# Patient Record
Sex: Male | Born: 1950 | Race: Black or African American | Hispanic: No | Marital: Married | Smoking: Former smoker
Health system: Southern US, Community
[De-identification: ages and names within clinical notes are randomized; demographics above are authoritative.]

## PROBLEM LIST (undated history)

## (undated) DIAGNOSIS — R7303 Prediabetes: Secondary | ICD-10-CM

## (undated) DIAGNOSIS — E559 Vitamin D deficiency, unspecified: Secondary | ICD-10-CM

## (undated) DIAGNOSIS — J189 Pneumonia, unspecified organism: Secondary | ICD-10-CM

## (undated) DIAGNOSIS — D509 Iron deficiency anemia, unspecified: Secondary | ICD-10-CM

## (undated) DIAGNOSIS — N400 Enlarged prostate without lower urinary tract symptoms: Secondary | ICD-10-CM

## (undated) DIAGNOSIS — E785 Hyperlipidemia, unspecified: Secondary | ICD-10-CM

## (undated) DIAGNOSIS — E669 Obesity, unspecified: Secondary | ICD-10-CM

## (undated) DIAGNOSIS — I10 Essential (primary) hypertension: Secondary | ICD-10-CM

## (undated) DIAGNOSIS — R972 Elevated prostate specific antigen [PSA]: Secondary | ICD-10-CM

## (undated) HISTORY — DX: Iron deficiency anemia, unspecified: D50.9

## (undated) HISTORY — PX: CATARACT EXTRACTION: SUR2

## (undated) HISTORY — DX: Obesity, unspecified: E66.9

## (undated) HISTORY — PX: PROSTATE BIOPSY: SHX241

## (undated) HISTORY — DX: Prediabetes: R73.03

## (undated) HISTORY — PX: HEMORRHOID SURGERY: SHX153

## (undated) HISTORY — DX: Hyperlipidemia, unspecified: E78.5

## (undated) HISTORY — DX: Vitamin D deficiency, unspecified: E55.9

---

## 2002-10-27 ENCOUNTER — Ambulatory Visit (HOSPITAL_COMMUNITY): Admission: RE | Admit: 2002-10-27 | Discharge: 2002-10-27 | Payer: Self-pay | Admitting: Internal Medicine

## 2002-11-02 ENCOUNTER — Ambulatory Visit (HOSPITAL_COMMUNITY): Admission: RE | Admit: 2002-11-02 | Discharge: 2002-11-02 | Payer: Self-pay | Admitting: General Surgery

## 2018-05-10 ENCOUNTER — Other Ambulatory Visit: Payer: Self-pay

## 2018-05-10 ENCOUNTER — Emergency Department (HOSPITAL_COMMUNITY): Payer: Medicare Other

## 2018-05-10 ENCOUNTER — Encounter (HOSPITAL_COMMUNITY): Payer: Self-pay

## 2018-05-10 ENCOUNTER — Inpatient Hospital Stay (HOSPITAL_COMMUNITY)
Admission: EM | Admit: 2018-05-10 | Discharge: 2018-05-13 | DRG: 193 | Disposition: A | Discharge status: Home / Self Care | Payer: Medicare Other | Attending: Internal Medicine | Admitting: Internal Medicine

## 2018-05-10 DIAGNOSIS — N400 Enlarged prostate without lower urinary tract symptoms: Secondary | ICD-10-CM

## 2018-05-10 DIAGNOSIS — Z885 Allergy status to narcotic agent status: Secondary | ICD-10-CM

## 2018-05-10 DIAGNOSIS — R0902 Hypoxemia: Secondary | ICD-10-CM

## 2018-05-10 DIAGNOSIS — Z79899 Other long term (current) drug therapy: Secondary | ICD-10-CM | POA: Diagnosis not present

## 2018-05-10 DIAGNOSIS — N179 Acute kidney failure, unspecified: Secondary | ICD-10-CM | POA: Diagnosis not present

## 2018-05-10 DIAGNOSIS — I10 Essential (primary) hypertension: Secondary | ICD-10-CM

## 2018-05-10 DIAGNOSIS — Z7982 Long term (current) use of aspirin: Secondary | ICD-10-CM | POA: Diagnosis not present

## 2018-05-10 DIAGNOSIS — R3911 Hesitancy of micturition: Secondary | ICD-10-CM

## 2018-05-10 DIAGNOSIS — J9601 Acute respiratory failure with hypoxia: Secondary | ICD-10-CM | POA: Diagnosis present

## 2018-05-10 DIAGNOSIS — E876 Hypokalemia: Secondary | ICD-10-CM | POA: Diagnosis not present

## 2018-05-10 DIAGNOSIS — E860 Dehydration: Secondary | ICD-10-CM | POA: Diagnosis present

## 2018-05-10 DIAGNOSIS — R55 Syncope and collapse: Secondary | ICD-10-CM | POA: Diagnosis present

## 2018-05-10 DIAGNOSIS — J181 Lobar pneumonia, unspecified organism: Secondary | ICD-10-CM | POA: Diagnosis not present

## 2018-05-10 DIAGNOSIS — J189 Pneumonia, unspecified organism: Secondary | ICD-10-CM | POA: Diagnosis present

## 2018-05-10 DIAGNOSIS — S0990XA Unspecified injury of head, initial encounter: Secondary | ICD-10-CM | POA: Diagnosis present

## 2018-05-10 DIAGNOSIS — N401 Enlarged prostate with lower urinary tract symptoms: Secondary | ICD-10-CM | POA: Diagnosis not present

## 2018-05-10 DIAGNOSIS — W1839XA Other fall on same level, initial encounter: Secondary | ICD-10-CM | POA: Diagnosis present

## 2018-05-10 DIAGNOSIS — J9600 Acute respiratory failure, unspecified whether with hypoxia or hypercapnia: Secondary | ICD-10-CM | POA: Diagnosis present

## 2018-05-10 HISTORY — DX: Essential (primary) hypertension: I10

## 2018-05-10 HISTORY — DX: Benign prostatic hyperplasia without lower urinary tract symptoms: N40.0

## 2018-05-10 LAB — BASIC METABOLIC PANEL
Anion gap: 13 (ref 5–15)
BUN: 33 mg/dL — ABNORMAL HIGH (ref 8–23)
CO2: 27 mmol/L (ref 22–32)
Calcium: 8.3 mg/dL — ABNORMAL LOW (ref 8.9–10.3)
Chloride: 92 mmol/L — ABNORMAL LOW (ref 98–111)
Creatinine, Ser: 1.54 mg/dL — ABNORMAL HIGH (ref 0.61–1.24)
GFR calc Af Amer: 53 mL/min — ABNORMAL LOW (ref >60)
GFR calc non Af Amer: 46 mL/min — ABNORMAL LOW (ref >60)
Glucose, Bld: 127 mg/dL — ABNORMAL HIGH (ref 70–99)
Potassium: 3.2 mmol/L — ABNORMAL LOW (ref 3.5–5.1)
Sodium: 132 mmol/L — ABNORMAL LOW (ref 135–145)

## 2018-05-10 LAB — CBC WITH DIFFERENTIAL/PLATELET
Abs Immature Granulocytes: 0.02 10*3/uL (ref 0.00–0.07)
BASOS ABS: 0 10*3/uL (ref 0.0–0.1)
Basophils Relative: 0 %
Eosinophils Absolute: 0.1 10*3/uL (ref 0.0–0.5)
Eosinophils Relative: 2 %
HCT: 50.2 % (ref 39.0–52.0)
HEMOGLOBIN: 15.9 g/dL (ref 13.0–17.0)
Immature Granulocytes: 0 %
LYMPHS ABS: 0.6 10*3/uL — AB (ref 0.7–4.0)
Lymphocytes Relative: 13 %
MCH: 27.4 pg (ref 26.0–34.0)
MCHC: 31.7 g/dL (ref 30.0–36.0)
MCV: 86.6 fL (ref 80.0–100.0)
Monocytes Absolute: 1 10*3/uL (ref 0.1–1.0)
Monocytes Relative: 22 %
Neutro Abs: 2.8 10*3/uL (ref 1.7–7.7)
Neutrophils Relative %: 63 %
Platelets: 167 10*3/uL (ref 150–400)
RBC: 5.8 MIL/uL (ref 4.22–5.81)
RDW: 13.2 % (ref 11.5–15.5)
WBC: 4.5 10*3/uL (ref 4.0–10.5)
nRBC: 0 % (ref 0.0–0.2)

## 2018-05-10 MED ORDER — TAMSULOSIN HCL 0.4 MG PO CAPS
0.4000 mg | ORAL_CAPSULE | Freq: Every day | ORAL | Status: DC
  Administered 2018-05-10 – 2018-05-13 (×4): 0.4 mg via ORAL
  Filled 2018-05-10 (×5): qty 1

## 2018-05-10 MED ORDER — IOPAMIDOL (ISOVUE-370) INJECTION 76%
75.0000 mL | Freq: Once | INTRAVENOUS | Status: AC | PRN
  Administered 2018-05-10: 16:00:00 via INTRAVENOUS

## 2018-05-10 MED ORDER — ONDANSETRON HCL 4 MG/2ML IJ SOLN: 4.0000 mg | Freq: Four times a day (QID) | INTRAMUSCULAR | Status: DC | PRN

## 2018-05-10 MED ORDER — HYDRALAZINE HCL 20 MG/ML IJ SOLN: 5.0000 mg | INTRAMUSCULAR | Status: DC | PRN

## 2018-05-10 MED ORDER — SODIUM CHLORIDE 0.9 % IV BOLUS
1000.0000 mL | Freq: Once | INTRAVENOUS | Status: AC
  Administered 2018-05-10: 1000 mL via INTRAVENOUS

## 2018-05-10 MED ORDER — BENZONATATE 100 MG PO CAPS: 200.0000 mg | ORAL_CAPSULE | Freq: Three times a day (TID) | ORAL | Status: DC | PRN

## 2018-05-10 MED ORDER — ACETAMINOPHEN 325 MG PO TABS: 650.0000 mg | ORAL_TABLET | Freq: Four times a day (QID) | ORAL | Status: DC | PRN

## 2018-05-10 MED ORDER — ACETAMINOPHEN 650 MG RE SUPP: 650.0000 mg | Freq: Four times a day (QID) | RECTAL | Status: DC | PRN

## 2018-05-10 MED ORDER — POTASSIUM CHLORIDE CRYS ER 20 MEQ PO TBCR
10.0000 meq | EXTENDED_RELEASE_TABLET | Freq: Every day | ORAL | Status: DC
  Administered 2018-05-10 – 2018-05-13 (×4): 10 meq via ORAL
  Filled 2018-05-10 (×4): qty 1

## 2018-05-10 MED ORDER — AZITHROMYCIN 250 MG PO TABS
500.0000 mg | ORAL_TABLET | ORAL | Status: DC
  Administered 2018-05-10 – 2018-05-11 (×2): 500 mg via ORAL
  Filled 2018-05-10 (×2): qty 2

## 2018-05-10 MED ORDER — SODIUM CHLORIDE 0.9 % IV SOLN
1.0000 g | INTRAVENOUS | Status: DC
  Administered 2018-05-10 – 2018-05-11 (×2): 1 g via INTRAVENOUS
  Filled 2018-05-10: qty 1
  Filled 2018-05-10: qty 10

## 2018-05-10 MED ORDER — HEPARIN SODIUM (PORCINE) 5000 UNIT/ML IJ SOLN
5000.0000 [IU] | Freq: Three times a day (TID) | INTRAMUSCULAR | Status: DC
  Administered 2018-05-11 – 2018-05-13 (×8): 5000 [IU] via SUBCUTANEOUS
  Filled 2018-05-10 (×7): qty 1

## 2018-05-10 MED ORDER — SODIUM CHLORIDE 0.9 % IV SOLN
INTRAVENOUS | Status: DC
  Administered 2018-05-10 – 2018-05-11 (×2): via INTRAVENOUS

## 2018-05-10 MED ORDER — CHLORTHALIDONE 25 MG PO TABS
25.0000 mg | ORAL_TABLET | Freq: Every day | ORAL | Status: DC
  Administered 2018-05-11 – 2018-05-13 (×3): 25 mg via ORAL
  Filled 2018-05-10 (×4): qty 1

## 2018-05-10 MED ORDER — ONDANSETRON HCL 4 MG PO TABS: 4.0000 mg | ORAL_TABLET | Freq: Four times a day (QID) | ORAL | Status: DC | PRN

## 2018-05-10 MED ORDER — IPRATROPIUM-ALBUTEROL 0.5-2.5 (3) MG/3ML IN SOLN
3.0000 mL | Freq: Once | RESPIRATORY_TRACT | Status: AC
  Administered 2018-05-10: 3 mL via RESPIRATORY_TRACT
  Filled 2018-05-10: qty 3

## 2018-05-10 MED ORDER — SODIUM CHLORIDE 0.9 % IV BOLUS
500.0000 mL | Freq: Once | INTRAVENOUS | Status: AC
  Administered 2018-05-10: 500 mL via INTRAVENOUS

## 2018-05-10 NOTE — ED Notes (Signed)
Pt given Ginger ale at this time.

## 2018-05-10 NOTE — H&P (Addendum)
History and Physical    Jose Scott WUJ:[id number] DOB: January 18, 1951 DOA: 05/10/2018  PCP: Samuel JesterButler, Cynthia, DO Patient coming from: home  Chief Complaint: Syncope  HPI: Jose Scott is a 68 y.o. male with medical history significant of BPH, HTN.   Pt was in his usual state of health until 7 days ago when he developed URI sx. Pt sx included sinus congestion and nasal discharge, PND, cough w/ occasional production. Denies any CP, wheezing. States that he's had very poor oral intake over this time due to simply not feeling well. States he has had fevers and has been in bed a fair amount this week. 2 nights prior to admission pt reports getting up to urinate at 01:00. Felt very weak when initially getting out of bed. Had to hold onto the side of the bed initially. PT reports making it to the bathroom and having a BM. Immediately after standing from having a BM pt felt very lightheaded and had to sit down. Pt then endorses slumping over and waking up to his son standing over him. Denies any head trauma, palpitations, HA, n/v, unilateral weakness, cognitive decline. Pt states that the following night the exact same thing occurred at approximately 02:30. Both times pt was helped back to his bed by his son and felt better once in bed.   ED Course: objective infromation included below.   Review of Systems: As per HPI otherwise all other systems reviewed and are negative  Ambulatory Status:no restrictions  Past Medical History:  Diagnosis Date  . Enlarged prostate   . Hypertension     Past Surgical History:  Procedure Laterality Date  . CATARACT EXTRACTION    . PROSTATE BIOPSY      Social History   Socioeconomic History  . Marital status: Married    Spouse name: Not on file  . Number of children: Not on file  . Years of education: Not on file  . Highest education level: Not on file  Occupational History  . Not on file  Social Needs  . Financial resource strain: Not on file  .  Food insecurity:    Worry: Not on file    Inability: Not on file  . Transportation needs:    Medical: Not on file    Non-medical: Not on file  Tobacco Use  . Smoking status: Never Smoker  . Smokeless tobacco: Never Used  Substance and Sexual Activity  . Alcohol use: Never    Frequency: Never  . Drug use: Never  . Sexual activity: Not on file  Lifestyle  . Physical activity:    Days per week: Not on file    Minutes per session: Not on file  . Stress: Not on file  Relationships  . Social connections:    Talks on phone: Not on file    Gets together: Not on file    Attends religious service: Not on file    Active member of club or organization: Not on file    Attends meetings of clubs or organizations: Not on file    Relationship status: Not on file  . Intimate partner violence:    Fear of current or ex partner: Not on file    Emotionally abused: Not on file    Physically abused: Not on file    Forced sexual activity: Not on file  Other Topics Concern  . Not on file  Social History Narrative  . Not on file    Allergies  Allergen Reactions  .  Codeine    Family History - Hypertension   Prior to Admission medications   Medication Sig Start Date End Date Taking? Authorizing Provider  amLODipine-benazepril (LOTREL) 10-20 MG capsule Take 1 capsule by mouth daily. 03/01/18  Yes [provider]  aspirin 325 MG tablet Take 1 tablet by mouth daily as needed.    Yes [provider]  chlorthalidone (HYGROTON) 25 MG tablet Take 1 tablet by mouth daily. 05/07/18  Yes [provider]  Omega-3 Fatty Acids (FISH OIL) 1000 MG CAPS Take 1 capsule by mouth daily.   Yes [provider]  potassium chloride (KLOR-CON) 8 MEQ tablet Take 1 tablet by mouth daily. 12/10/17  Yes [provider]  tamsulosin (FLOMAX) 0.4 MG CAPS capsule Take 1 capsule by mouth daily. 01/02/18  Yes [provider]  tetrahydrozoline 0.05 % ophthalmic solution Place  2 drops into both eyes 2 (two) times daily as needed.   Yes [provider]    Physical Exam: Vitals:   05/10/18 1530 05/10/18 1600 05/10/18 1630 05/10/18 1700  BP: (!) 151/65 132/87 137/78 135/74  Pulse:  68 67 61  Resp: 17 14 17 13   Temp:      TempSrc:      SpO2:  91% 90% (!) 89%  Weight:      Height:         General:  Appears calm and comfortable Eyes:  PERRL, EOMI, normal lids, iris ENT: Dry MN grossly normal hearing, lips & tongue. Neck:  no LAD, masses or thyromegaly Cardiovascular:  RRR, no m/r/g. No LE edema.  Respiratory: Course breath sounds in the LLL w/ ronchi and wheezing throughout all lung fields L>R Abdomen:  soft, ntnd, NABS Skin:  no rash or induration seen on limited exam Musculoskeletal:  grossly normal tone BUE/BLE, good ROM, no bony abnormality Psychiatric:  grossly normal mood and affect, speech fluent and appropriate, AOx3 Neurologic:  CN 2-12 grossly intact, moves all extremities in coordinated fashion, sensation intact  Labs on Admission: I have personally reviewed following labs and imaging studies  CBC: Recent Labs  Lab 05/10/18 0935  WBC 4.5  NEUTROABS 2.8  HGB 15.9  HCT 50.2  MCV 86.6  PLT 167   Basic Metabolic Panel: Recent Labs  Lab 05/10/18 0935  NA 132*  K 3.2*  CL 92*  CO2 27  GLUCOSE 127*  BUN 33*  CREATININE 1.54*  CALCIUM 8.3*   GFR: Estimated Creatinine Clearance: 57.8 mL/min (A) (by C-G formula based on SCr of 1.54 mg/dL (H)). Liver Function Tests: No results for input(s): AST, ALT, ALKPHOS, BILITOT, PROT, ALBUMIN in the last 168 hours. No results for input(s): LIPASE, AMYLASE in the last 168 hours. No results for input(s): AMMONIA in the last 168 hours. Coagulation Profile: No results for input(s): INR, PROTIME in the last 168 hours. Cardiac Enzymes: No results for input(s): CKTOTAL, CKMB, CKMBINDEX, TROPONINI in the last 168 hours. BNP (last 3 results) No results for input(s): PROBNP in the last 8760  hours. HbA1C: No results for input(s): HGBA1C in the last 72 hours. CBG: No results for input(s): GLUCAP in the last 168 hours. Lipid Profile: No results for input(s): CHOL, HDL, LDLCALC, TRIG, CHOLHDL, LDLDIRECT in the last 72 hours. Thyroid Function Tests: No results for input(s): TSH, T4TOTAL, FREET4, T3FREE, THYROIDAB in the last 72 hours. Anemia Panel: No results for input(s): VITAMINB12, FOLATE, FERRITIN, TIBC, IRON, RETICCTPCT in the last 72 hours. Urine analysis: No results found for: COLORURINE, APPEARANCEUR, LABSPEC, PHURINE, GLUCOSEU,  HGBUR, BILIRUBINUR, KETONESUR, PROTEINUR, UROBILINOGEN, NITRITE, LEUKOCYTESUR  Creatinine Clearance: Estimated Creatinine Clearance: 57.8 mL/min (A) (by C-G formula based on SCr of 1.54 mg/dL (H)).  Sepsis Labs: @LABRCNTIP (procalcitonin:4,lacticidven:4) )No results found for this or any previous visit (from the past 240 hour(s)).   Radiological Exams on Admission: Dg Chest 2 View  Result Date: 05/10/2018 CLINICAL DATA:  Shortness of breath with cough and fever EXAM: CHEST - 2 VIEW COMPARISON:  None. FINDINGS: There is slight bibasilar atelectasis. The lungs elsewhere are clear. Heart size and pulmonary vascularity are normal. No adenopathy. No bone lesions. IMPRESSION: Mild bibasilar atelectasis. No edema or consolidation. Heart size within normal limits. Electronically Signed   By: Bretta Bang III M.D.   On: 05/10/2018 10:09   Ct Angio Chest Pe W/cm &/or Wo Cm  Result Date: 05/10/2018 CLINICAL DATA:  Cough and fever.  Body aches. EXAM: CT ANGIOGRAPHY CHEST WITH CONTRAST TECHNIQUE: Multidetector CT imaging of the chest was performed using the standard protocol during bolus administration of intravenous contrast. Multiplanar CT image reconstructions and MIPs were obtained to evaluate the vascular anatomy. CONTRAST:  <See Chart> ISOVUE-370 IOPAMIDOL (ISOVUE-370) INJECTION 76% COMPARISON:  Radiograph 05/10/2018 FINDINGS: Cardiovascular: No  filling defects within the pulmonary arteries to suggest acute pulmonary embolism. No acute findings of the aorta or great vessels. No pericardial fluid. Mediastinum/Nodes: No axillary supraclavicular adenopathy. No mediastinal hilar adenopathy. No pericardial effusion. Esophagus normal. Lungs/Pleura: Paraseptal emphysema in the lung apices. There is bibasilar peribronchial airway thickening and mild consolidation (image 100/6). Similar findings in the inferior lingula. No measurable nodularity. Upper Abdomen: Limited view of the liver, kidneys, pancreas are unremarkable. Normal adrenal glands. Musculoskeletal: No aggressive osseous lesion. Review of the MIP images confirms the above findings. IMPRESSION: 1. No evidence acute pulmonary embolism. 2. Peribronchial thickening with mild consolidation in lower lobes suggests bibasilar pneumonia versus aspiration pneumonitis. Electronically Signed   By: Genevive Bi M.D.   On: 05/10/2018 16:08    EKG: Independently reviewed. Sinus. Numerous PACs. No ACS  Assessment/Plan Active Problems:   Acute respiratory failure (HCC)   CAP (community acquired pneumonia)   Essential hypertension   BPH (benign prostatic hyperplasia)   AKI (acute kidney injury) (HCC)   Acute Respiratory Failure: Hypoxemia noted in ED w/ CT suggestive of pneumonia. Doubt aspiration as indicated by CT scan. Remote h/o smoking and doubt COPD at play. Place on O2 in the ED. Blood cx pending. - Rocephin/Azithro - Wean O2 as needed - Sputum cx - Pneumonia protocol - Consider repeat CT scan in 3 mo after DC to ensure no underlying pulmonary process that is hidden by CAP.   AKI: Unknown renal function but pt w/o h/o the same. Cr 1.5 - IVF - BMP in am  Syncope: suspect multifactorial including vasovagal and dehydration. Occurred both times in the middle of the night after BM. Doubt seizure, cardiac, CVA.  - Tele - tx of CAP as above - IVF for AKI  HTN: at goal - Hydralazine PRN -  Continue Chlorthalidone, Amlodipine - Hold Benazapril for the time being  BPH: - contineu flomax    DVT prophylaxis: Hep  Code Status: full  Family Communication: neice  Disposition Plan: pending improvement in respiratory status  Consults called: none  Admission status: inpt    Ozella Rocks MD [facility]  If 7PM-7AM, please contact night-coverage [email or web address] Password [facility]  05/10/2018, 5:46 PM

## 2018-05-10 NOTE — ED Notes (Signed)
Pt ambulated in hall with nursing stand by assistance. Pt became "lightheaded" with heavy breathing during the short walk. Pt's O2 sat dropped to 80% on RA during ambulation. Pt returned to bed and placed back on O2 at 2L via [place] with O2 sats increasing to 92%. Pt's HR maintained in the 80-90bpm range during ambulation.

## 2018-05-10 NOTE — ED Triage Notes (Signed)
Pt reports cough, fever, body aches and generalized weakness since Monday.  Reports passed out twice in the last 2 days.  Reports Thursday he fell twice.

## 2018-05-10 NOTE — ED Notes (Signed)
Pt placed on hospital bed

## 2018-05-10 NOTE — ED Provider Notes (Addendum)
[facility] EMERGENCY DEPARTMENT Provider Note   CSN: [id number] Arrival date & time: 05/10/18  0846     History   Chief Complaint Chief Complaint  Patient presents with  . Fatigue    HPI Jose Scott is a 68 y.o. male.  HPI Patient presents for evaluation of generalized weakness, possibly causing him to pass out and fall.  He states that last night and the night before, he suddenly passed out while standing.  He feels like he is bumped his head and scraped his right arm when he fell last night.  He has had decreased appetite for about 7 days, because "food does not taste right."  He denies fever, chills, nausea, vomiting.  He has a cough, productive of yellow sputum.  He denies chest pain, abdominal pain, back pain, focal weakness or paresthesia.  No similar problems in the past.  There are no other known modifying factors.   Past Medical History:  Diagnosis Date  . Enlarged prostate   . Hypertension     Patient Active Problem List   Diagnosis Date Noted  . Acute respiratory failure (HCC) 05/10/2018    Past Surgical History:  Procedure Laterality Date  . CATARACT EXTRACTION    . PROSTATE BIOPSY          Home Medications    Prior to Admission medications   Medication Sig Start Date End Date Taking? Authorizing Provider  amLODipine-benazepril (LOTREL) 10-20 MG capsule Take 1 capsule by mouth daily. 03/01/18  Yes [provider]  aspirin 325 MG tablet Take 1 tablet by mouth daily as needed.    Yes [provider]  chlorthalidone (HYGROTON) 25 MG tablet Take 1 tablet by mouth daily. 05/07/18  Yes [provider]  Omega-3 Fatty Acids (FISH OIL) 1000 MG CAPS Take 1 capsule by mouth daily.   Yes [provider]  potassium chloride (KLOR-CON) 8 MEQ tablet Take 1 tablet by mouth daily. 12/10/17  Yes [provider]  tamsulosin (FLOMAX) 0.4 MG CAPS capsule Take 1 capsule by mouth daily. 01/02/18  Yes [provider]    tetrahydrozoline 0.05 % ophthalmic solution Place 2 drops into both eyes 2 (two) times daily as needed.   Yes [provider]    Family History No family history on file.  Social History Social History   Tobacco Use  . Smoking status: Never Smoker  . Smokeless tobacco: Never Used  Substance Use Topics  . Alcohol use: Never    Frequency: Never  . Drug use: Never     Allergies   Codeine   Review of Systems Review of Systems  All other systems reviewed and are negative.    Physical Exam Updated Vital Signs BP 132/87   Pulse 68   Temp 98.2 F (36.8 C) (Oral)   Resp 14   Ht  (1.803 m)   Wt 106.6 kg   SpO2 91%   BMI 32.78 kg/m   Physical Exam Vitals signs and nursing note reviewed.  Constitutional:      General: He is not in acute distress.    Appearance: Normal appearance. He is well-developed and normal weight. He is not ill-appearing, toxic-appearing or diaphoretic.  HENT:     Head: Normocephalic.     Comments: Small contusion and abrasion left forehead.  No associated crepitation or deformity.  No trismus.  No midface instability or crepitation.    Right Ear: External ear normal.     Left Ear: External ear normal.  Eyes:     Conjunctiva/sclera: Conjunctivae normal.     Pupils: Pupils are equal, round, and reactive to light.  Neck:     Musculoskeletal: Normal range of motion and neck supple.     Trachea: Phonation normal.  Cardiovascular:     Rate and Rhythm: Normal rate and regular rhythm.     Heart sounds: Normal heart sounds.  Pulmonary:     Effort: Pulmonary effort is normal.     Breath sounds: Normal breath sounds.  Abdominal:     Palpations: Abdomen is soft.     Tenderness: There is no abdominal tenderness.  Musculoskeletal: Normal range of motion.        General: No swelling, tenderness or signs of injury.  Skin:    General: Skin is warm and dry.  Neurological:     Mental Status: He is alert and oriented to person, place,  and time.     Cranial Nerves: No cranial nerve deficit.     Sensory: No sensory deficit.     Motor: No abnormal muscle tone.     Coordination: Coordination normal.     Comments: No dysarthria, aphasia or nystagmus.  Psychiatric:        Mood and Affect: Mood normal.        Behavior: Behavior normal.        Thought Content: Thought content normal.        Judgment: Judgment normal.      ED Treatments / Results  Labs (all labs ordered are listed, but only abnormal results are displayed) Labs Reviewed  BASIC METABOLIC PANEL - Abnormal; Notable for the following components:      Result Value   Sodium 132 (*)    Potassium 3.2 (*)    Chloride 92 (*)    Glucose, Bld 127 (*)    BUN 33 (*)    Creatinine, Ser 1.54 (*)    Calcium 8.3 (*)    GFR calc non Af Amer 46 (*)    GFR calc Af Amer 53 (*)    All other components within normal limits  CBC WITH DIFFERENTIAL/PLATELET - Abnormal; Notable for the following components:   Lymphs Abs 0.6 (*)    All other components within normal limits    EKG EKG Interpretation  Date/Time:  Saturday May 10 2018 09:14:11 EST Ventricular Rate:  81 PR Interval:    QRS Duration: 92 QT Interval:  394 QTC Calculation: 410 R Axis:   -24 Text Interpretation:  Sinus rhythm Atrial premature complexes Borderline left axis deviation No old tracing to compare Confirmed by Mancel Bale [phone]) on 05/10/2018 11:28:22 AM   Radiology Dg Chest 2 View  Result Date: 05/10/2018 CLINICAL DATA:  Shortness of breath with cough and fever EXAM: CHEST - 2 VIEW COMPARISON:  None. FINDINGS: There is slight bibasilar atelectasis. The lungs elsewhere are clear. Heart size and pulmonary vascularity are normal. No adenopathy. No bone lesions. IMPRESSION: Mild bibasilar atelectasis. No edema or consolidation. Heart size within normal limits. Electronically Signed   By: Bretta Bang III M.D.   On: 05/10/2018 10:09   Ct Angio Chest Pe W/cm &/or Wo Cm  Result Date:  05/10/2018 CLINICAL DATA:  Cough and fever.  Body aches. EXAM: CT ANGIOGRAPHY CHEST WITH CONTRAST TECHNIQUE: Multidetector CT imaging of the chest was performed using the standard protocol during bolus administration of intravenous contrast. Multiplanar CT image reconstructions and MIPs were obtained to evaluate the vascular anatomy. CONTRAST:  <See Chart> ISOVUE-370 IOPAMIDOL (ISOVUE-370) INJECTION  76% COMPARISON:  Radiograph 05/10/2018 FINDINGS: Cardiovascular: No filling defects within the pulmonary arteries to suggest acute pulmonary embolism. No acute findings of the aorta or great vessels. No pericardial fluid. Mediastinum/Nodes: No axillary supraclavicular adenopathy. No mediastinal hilar adenopathy. No pericardial effusion. Esophagus normal. Lungs/Pleura: Paraseptal emphysema in the lung apices. There is bibasilar peribronchial airway thickening and mild consolidation (image 100/6). Similar findings in the inferior lingula. No measurable nodularity. Upper Abdomen: Limited view of the liver, kidneys, pancreas are unremarkable. Normal adrenal glands. Musculoskeletal: No aggressive osseous lesion. Review of the MIP images confirms the above findings. IMPRESSION: 1. No evidence acute pulmonary embolism. 2. Peribronchial thickening with mild consolidation in lower lobes suggests bibasilar pneumonia versus aspiration pneumonitis. Electronically Signed   By: Genevive Bi M.D.   On: 05/10/2018 16:08    Procedures .Critical Care Performed by: Mancel Bale, MD Authorized by: Mancel Bale, MD   Critical care provider statement:    Critical care time (minutes):  45   Critical care start time:  05/10/2018 10:35 AM   Critical care end time:  05/10/2018 4:03 PM   Critical care time was exclusive of:  Separately billable procedures and treating other patients   Critical care was necessary to treat or prevent imminent or life-threatening deterioration of the following conditions:  Respiratory failure    Critical care was time spent personally by me on the following activities:  Blood draw for specimens, development of treatment plan with patient or surrogate, discussions with consultants, evaluation of patient's response to treatment, examination of patient, obtaining history from patient or surrogate, ordering and performing treatments and interventions, ordering and review of laboratory studies, pulse oximetry, re-evaluation of patient's condition, review of old charts and ordering and review of radiographic studies   (including critical care time)  Medications Ordered in ED Medications  sodium chloride 0.9 % bolus 1,000 mL (0 mLs Intravenous Stopped 05/10/18 1226)  ipratropium-albuterol (DUONEB) 0.5-2.5 (3) MG/3ML nebulizer solution 3 mL (3 mLs Nebulization Given 05/10/18 1140)  sodium chloride 0.9 % bolus 500 mL (0 mLs Intravenous Stopped 05/10/18 1547)  iopamidol (ISOVUE-370) 76 % injection 75 mL ( Intravenous Contrast Given 05/10/18 1530)     Initial Impression / Assessment and Plan / ED Course  I have reviewed the triage vital signs and the nursing notes.  Pertinent labs & imaging results that were available during my care of the patient were reviewed by me and considered in my medical decision making (see chart for details).  Clinical Course as of May 10 1634  Sat May 10, 2018  1037 Hypoxia   [EW]  1052 Normal  CBC with Differential(!) [EW]  1052 Normal except sodium low, potassium low, glucose high, BUN high, creatinine high, calcium low, GFR low  Basic metabolic panel(!) [EW]  1350 No CHF or infiltrate, images reviewed by me  DG Chest 2 View [EW]  1450 Ambulation trial done on room air, patient's oxygen saturation dropped to the low 80s.  He expects shortness of breath at this time.  CT angiogram chest ordered to evaluate for PE or occult pulmonary process causing hypoxia.   [EW]  1635 No PE.  Possible pneumonitis or aspiration.  CT Angio Chest PE W/Cm &/Or Wo Cm [EW]      Clinical Course User Index [EW] Mancel Bale, MD     Patient Vitals for the past 24 hrs:  BP Temp Temp src Pulse Resp SpO2 Height Weight  05/10/18 1600 132/87 - - 68 14 91 % - -  05/10/18  1530 (!) 151/65 - - - 17 - - -  05/10/18 1500 121/68 - - 61 15 (!) 88 % - -  05/10/18 1430 124/61 - - 78 14 (!) 88 % - -  05/10/18 1415 (!) 145/72 - - 80 17 (!) 84 % - -  05/10/18 1330 121/67 - - 68 10 91 % - -  05/10/18 1300 103/64 - - 74 (!) 9 92 % - -  05/10/18 1235 119/70 - - 71 15 90 % - -  05/10/18 1230 119/70 - - 83 16 (!) 87 % - -  05/10/18 1200 125/79 - - 68 14 96 % - -  05/10/18 1141 - - - - - 93 % - -  05/10/18 1130 112/73 - - 68 11 90 % - -  05/10/18 1120 125/72 - - 70 16 92 % - -  05/10/18 1100 (!) 145/73 - - 76 (!) 25 91 % - -  05/10/18 0941 - - - 87 15 93 % - -  05/10/18 0930 137/74 - - 71 18 (!) 89 % - -  05/10/18 0913 (!) 145/68 98.2 F (36.8 C) Oral 79 20 (!) 88 % - -  05/10/18 0905 - - - - - -  (1.803 m) 106.6 kg    1:50 PM Reevaluation with update and discussion. After initial assessment and treatment, an updated evaluation reveals he is eating, comfortable but still hypoxic requiring oxygen treatment. Mancel Bale   Medical Decision Making: Fall x2 with syncope, and presenting with hypoxia.  Doubt serious intracranial or spine injuries.  No overt respiratory symptoms.  Unspecified decreased oral intake recently.  Screening evaluation indicative of mild metabolic abnormalities consistent with decreased oral intake, and renal insufficiency.  Comparison labs for renal function.  CT imaging ordered to evaluate for pulmonary embolus.  CRITICAL CARE- yes- 45 minutes, see note above Performed by: Mancel Bale   Nursing Notes Reviewed/ Care Coordinated Applicable Imaging Reviewed Interpretation of Laboratory Data incorporated into ED treatment  4:25 PM-Consult complete with hospitalist. Patient case explained and discussed.  He agrees to admit patient for further  evaluation and treatment. Call ended at 4:30 PM    Final Clinical Impressions(s) / ED Diagnoses   Final diagnoses:  Hypoxia  Syncope, unspecified syncope type  Injury of head, initial encounter    ED Discharge Orders    None       Mancel Bale, MD 05/10/18 1636    Mancel Bale, MD 05/24/18 1222

## 2018-05-11 ENCOUNTER — Other Ambulatory Visit: Payer: Self-pay

## 2018-05-11 LAB — BASIC METABOLIC PANEL
Anion gap: 11 (ref 5–15)
BUN: 18 mg/dL (ref 8–23)
CHLORIDE: 96 mmol/L — AB (ref 98–111)
CO2: 28 mmol/L (ref 22–32)
Calcium: 8 mg/dL — ABNORMAL LOW (ref 8.9–10.3)
Creatinine, Ser: 1.07 mg/dL (ref 0.61–1.24)
GFR calc Af Amer: >60 mL/min (ref >60)
GFR calc non Af Amer: >60 mL/min (ref >60)
Glucose, Bld: 99 mg/dL (ref 70–99)
Potassium: 3.2 mmol/L — ABNORMAL LOW (ref 3.5–5.1)
Sodium: 135 mmol/L (ref 135–145)

## 2018-05-11 MED ORDER — IPRATROPIUM-ALBUTEROL 0.5-2.5 (3) MG/3ML IN SOLN
3.0000 mL | Freq: Four times a day (QID) | RESPIRATORY_TRACT | Status: DC
  Administered 2018-05-11 – 2018-05-13 (×8): 3 mL via RESPIRATORY_TRACT
  Filled 2018-05-11 (×7): qty 3

## 2018-05-11 MED ORDER — DIPHENHYDRAMINE HCL 50 MG/ML IJ SOLN
50.0000 mg | Freq: Once | INTRAMUSCULAR | Status: AC
  Administered 2018-05-11: 50 mg via INTRAVENOUS
  Administered 2018-05-12: 25 mg via INTRAVENOUS
  Filled 2018-05-11: qty 1

## 2018-05-11 MED ORDER — FAMOTIDINE IN NACL 20-0.9 MG/50ML-% IV SOLN
20.0000 mg | Freq: Two times a day (BID) | INTRAVENOUS | Status: DC
  Administered 2018-05-11 – 2018-05-13 (×4): 20 mg via INTRAVENOUS
  Filled 2018-05-11 (×4): qty 50

## 2018-05-11 MED ORDER — METHYLPREDNISOLONE SODIUM SUCC 40 MG IJ SOLR
40.0000 mg | Freq: Once | INTRAMUSCULAR | Status: AC
  Administered 2018-05-11: 40 mg via INTRAVENOUS
  Filled 2018-05-11: qty 1

## 2018-05-11 MED ORDER — PNEUMOCOCCAL VAC POLYVALENT 25 MCG/0.5ML IJ INJ
0.5000 mL | INJECTION | INTRAMUSCULAR | Status: DC
  Filled 2018-05-11: qty 0.5

## 2018-05-11 MED ORDER — POTASSIUM CHLORIDE CRYS ER 20 MEQ PO TBCR
40.0000 meq | EXTENDED_RELEASE_TABLET | Freq: Once | ORAL | Status: AC
  Administered 2018-05-11: 40 meq via ORAL
  Filled 2018-05-11: qty 2

## 2018-05-11 MED ORDER — LEVOFLOXACIN IN D5W 750 MG/150ML IV SOLN
750.0000 mg | INTRAVENOUS | Status: DC
  Administered 2018-05-12: 750 mg via INTRAVENOUS
  Filled 2018-05-11: qty 150

## 2018-05-11 MED ORDER — GUAIFENESIN ER 600 MG PO TB12
1200.0000 mg | ORAL_TABLET | Freq: Two times a day (BID) | ORAL | Status: DC
  Administered 2018-05-11 – 2018-05-13 (×5): 1200 mg via ORAL
  Filled 2018-05-11 (×8): qty 2

## 2018-05-11 MED ORDER — ORAL CARE MOUTH RINSE
15.0000 mL | Freq: Two times a day (BID) | OROMUCOSAL | Status: DC
  Administered 2018-05-11 – 2018-05-12 (×2): 15 mL via OROMUCOSAL

## 2018-05-11 NOTE — Progress Notes (Signed)
Pharmacy Antibiotic Note  Jose Scott is a 68 y.o. male admitted on 05/10/2018 with CAP.  Pharmacy has been consulted for Levaquin dosing.  Plan: Levaquin 750 mg IV every 24 hours.  Will start 24 hours after Ceftriaxone/Levaquin given. Monitor labs, c/s, and patient improvement.  Height: 5\' 11"  (180.3 cm) Weight: 235 lb (106.6 kg) IBW/kg (Calculated) : 75.3  Temp (24hrs), Avg:98.4 F (36.9 C), Min:98.3 F (36.8 C), Max:98.4 F (36.9 C)  Recent Labs  Lab 05/10/18 0935 05/11/18 0637  WBC 4.5  --   CREATININE 1.54* 1.07    Estimated Creatinine Clearance: 83.2 mL/min (by C-G formula based on SCr of 1.07 mg/dL).    Allergies  Allergen Reactions  . Codeine     Antimicrobials this admission: Levaquin 2/17 >>  Azithromycin 2/15 >> 2/16 Ceftriaxone 2/15 >>2/16  Dose adjustments this admission: N/A  Microbiology results: 2/15 BCx: ngtd  2/15 Sputum: pending     Thank you for allowing pharmacy to be a part of this patient's care.  Tad Moore 05/11/2018 8:59 PM

## 2018-05-11 NOTE — Progress Notes (Signed)
PROGRESS NOTE    Jose Scott  [id number] DOB: 07-07-1950 DOA: 05/10/2018 PCP: Samuel JesterButler, Cynthia, DO    Brief Narrative:  68 year old male with a history of hypertension and BPH, admitted to the hospital with syncope.  He was noted to be dehydrated with acute kidney injury and found to have community-acquired pneumonia.  He was also noted to be hypoxic requiring supplemental oxygen.  Patient was started on IV fluids, intravenous antibiotics and supplemental oxygen.  Clinically he is improving.  Plan to discharge home once he is able to wean off of oxygen.   Assessment & Plan:   Active Problems:   Acute respiratory failure (HCC)   CAP (community acquired pneumonia)   Essential hypertension   BPH (benign prostatic hyperplasia)   AKI (acute kidney injury) (HCC)   1. Acute respiratory failure secondary to pneumonia.  Currently on 4 L of oxygen.  We will try and wean down as tolerated. 2. Community-acquired pneumonia.  Continue on ceftriaxone and azithromycin.  Since he does have some wheezing, will continue bronchodilators.  Add Mucinex. 3. Acute kidney injury.  Related to dehydration.  Improved with IV fluids. 4. Syncope.  Likely vasovagal in the setting of dehydration.  Hypoxia from pneumonia may have also played a role.  He has not had any recurrence. 5. H.  Continue on Flomax. 6. Hypertension.  Continue chlorthalidone.  Amlodipine/benazepril currently on hold since blood pressures are stable. 7. Hypokalemia.  Replace   DVT prophylaxis: Heparin Code Status: Full code Family Communication: No family present Disposition Plan: Discharge home when he is able to ambulate off of oxygen   Consultants:     Procedures:     Antimicrobials:   Ceftriaxone 2/15 >  Azithromycin 2/15 >   Subjective: He is feeling better since admission.  He continues to have cough.  Wants to get up and move around.  He says he walked last night in the emergency room when oxygen was removed  and began to feel lightheaded.  Currently on 4 L of oxygen  Objective: Vitals:   05/11/18 0530 05/11/18 0600 05/11/18 0700 05/11/18 0734  BP: 138/71 123/75  (!) 135/92  Pulse: 60 64 74 78  Resp: 16 17 15 16   Temp:      TempSrc:      SpO2: 91% (!) 89% 91% 91%  Weight:      Height:        Intake/Output Summary (Last 24 hours) at 05/11/2018 0846 Last data filed at 05/11/2018 0716 Gross per 24 hour  Intake 1822 ml  Output 1770 ml  Net 52 ml   Filed Weights   05/10/18 0905  Weight: 106.6 kg    Examination:  General exam: Appears calm and comfortable  Respiratory system: Bilateral wheezes with occasional rhonchi. Respiratory effort normal. Cardiovascular system: S1 & S2 heard, RRR. No JVD, murmurs, rubs, gallops or clicks. No pedal edema. Gastrointestinal system: Abdomen is nondistended, soft and nontender. No organomegaly or masses felt. Normal bowel sounds heard. Central nervous system: Alert and oriented. No focal neurological deficits. Extremities: Symmetric 5 x 5 power. Skin: No rashes, lesions or ulcers Psychiatry: Judgement and insight appear normal. Mood & affect appropriate.     Data Reviewed: I have personally reviewed following labs and imaging studies  CBC: Recent Labs  Lab 05/10/18 0935  WBC 4.5  NEUTROABS 2.8  HGB 15.9  HCT 50.2  MCV 86.6  PLT 167   Basic Metabolic Panel: Recent Labs  Lab 05/10/18 0935 05/11/18 0637  NA  132* 135  K 3.2* 3.2*  CL 92* 96*  CO2 27 28  GLUCOSE 127* 99  BUN 33* 18  CREATININE 1.54* 1.07  CALCIUM 8.3* 8.0*   GFR: Estimated Creatinine Clearance: 83.2 mL/min (by C-G formula based on SCr of 1.07 mg/dL). Liver Function Tests: No results for input(s): AST, ALT, ALKPHOS, BILITOT, PROT, ALBUMIN in the last 168 hours. No results for input(s): LIPASE, AMYLASE in the last 168 hours. No results for input(s): AMMONIA in the last 168 hours. Coagulation Profile: No results for input(s): INR, PROTIME in the last 168  hours. Cardiac Enzymes: No results for input(s): CKTOTAL, CKMB, CKMBINDEX, TROPONINI in the last 168 hours. BNP (last 3 results) No results for input(s): PROBNP in the last 8760 hours. HbA1C: No results for input(s): HGBA1C in the last 72 hours. CBG: No results for input(s): GLUCAP in the last 168 hours. Lipid Profile: No results for input(s): CHOL, HDL, LDLCALC, TRIG, CHOLHDL, LDLDIRECT in the last 72 hours. Thyroid Function Tests: No results for input(s): TSH, T4TOTAL, FREET4, T3FREE, THYROIDAB in the last 72 hours. Anemia Panel: No results for input(s): VITAMINB12, FOLATE, FERRITIN, TIBC, IRON, RETICCTPCT in the last 72 hours. Sepsis Labs: No results for input(s): PROCALCITON, LATICACIDVEN in the last 168 hours.  Recent Results (from the past 240 hour(s))  Culture, blood (routine x 2) Call MD if unable to obtain prior to antibiotics being given     Status: None (Preliminary result)   Collection Time: 05/10/18  6:32 PM  Result Value Ref Range Status   Specimen Description BLOOD RIGHT ARM  Final   Special Requests   Final    BOTTLES DRAWN AEROBIC AND ANAEROBIC Blood Culture results may not be optimal due to an excessive volume of blood received in culture bottles   Culture   Final    NO GROWTH < 12 HOURS Performed at [facility], [address] [place], [place] [address]    Report Status PENDING  Incomplete  Culture, blood (routine x 2) Call MD if unable to obtain prior to antibiotics being given     Status: None (Preliminary result)   Collection Time: 05/10/18  6:45 PM  Result Value Ref Range Status   Specimen Description LEFT ANTECUBITAL  Final   Special Requests   Final    BOTTLES DRAWN AEROBIC AND ANAEROBIC Blood Culture adequate volume   Culture   Final    NO GROWTH < 12 HOURS Performed at [facility], [address] [place], [place] [address]    Report Status PENDING  Incomplete         Radiology Studies: Dg Chest 2 View  Result Date: 05/10/2018 CLINICAL  DATA:  Shortness of breath with cough and fever EXAM: CHEST - 2 VIEW COMPARISON:  None. FINDINGS: There is slight bibasilar atelectasis. The lungs elsewhere are clear. Heart size and pulmonary vascularity are normal. No adenopathy. No bone lesions. IMPRESSION: Mild bibasilar atelectasis. No edema or consolidation. Heart size within normal limits. Electronically Signed   By: Bretta Bang III M.D.   On: 05/10/2018 10:09   Ct Angio Chest Pe W/cm &/or Wo Cm  Result Date: 05/10/2018 CLINICAL DATA:  Cough and fever.  Body aches. EXAM: CT ANGIOGRAPHY CHEST WITH CONTRAST TECHNIQUE: Multidetector CT imaging of the chest was performed using the standard protocol during bolus administration of intravenous contrast. Multiplanar CT image reconstructions and MIPs were obtained to evaluate the vascular anatomy. CONTRAST:  <See Chart> ISOVUE-370 IOPAMIDOL (ISOVUE-370) INJECTION 76% COMPARISON:  Radiograph 05/10/2018 FINDINGS: Cardiovascular:  No filling defects within the pulmonary arteries to suggest acute pulmonary embolism. No acute findings of the aorta or great vessels. No pericardial fluid. Mediastinum/Nodes: No axillary supraclavicular adenopathy. No mediastinal hilar adenopathy. No pericardial effusion. Esophagus normal. Lungs/Pleura: Paraseptal emphysema in the lung apices. There is bibasilar peribronchial airway thickening and mild consolidation (image 100/6). Similar findings in the inferior lingula. No measurable nodularity. Upper Abdomen: Limited view of the liver, kidneys, pancreas are unremarkable. Normal adrenal glands. Musculoskeletal: No aggressive osseous lesion. Review of the MIP images confirms the above findings. IMPRESSION: 1. No evidence acute pulmonary embolism. 2. Peribronchial thickening with mild consolidation in lower lobes suggests bibasilar pneumonia versus aspiration pneumonitis. Electronically Signed   By: Genevive Bi M.D.   On: 05/10/2018 16:08        Scheduled Meds: .  azithromycin  500 mg Oral Q24H  . chlorthalidone  25 mg Oral Daily  . guaiFENesin  1,200 mg Oral BID  . heparin  5,000 Units Subcutaneous Q8H  . ipratropium-albuterol  3 mL Nebulization Q6H  . potassium chloride SA  10 mEq Oral Daily  . potassium chloride  40 mEq Oral Once  . tamsulosin  0.4 mg Oral Daily   Continuous Infusions: . cefTRIAXone (ROCEPHIN)  IV Stopped (05/10/18 1835)     LOS: 1 day    Time spent:    Erick Blinks, MD [facility]   If 7PM-7AM, please contact night-coverage [email or web address]  05/11/2018, 8:46 AM

## 2018-05-12 LAB — BASIC METABOLIC PANEL
Anion gap: 12 (ref 5–15)
BUN: 17 mg/dL (ref 8–23)
CO2: 27 mmol/L (ref 22–32)
Calcium: 8.7 mg/dL — ABNORMAL LOW (ref 8.9–10.3)
Chloride: 97 mmol/L — ABNORMAL LOW (ref 98–111)
Creatinine, Ser: 1.16 mg/dL (ref 0.61–1.24)
GFR calc Af Amer: >60 mL/min (ref >60)
Glucose, Bld: 119 mg/dL — ABNORMAL HIGH (ref 70–99)
Potassium: 3.9 mmol/L (ref 3.5–5.1)
Sodium: 136 mmol/L (ref 135–145)

## 2018-05-12 LAB — HIV ANTIBODY (ROUTINE TESTING W REFLEX): HIV Screen 4th Generation wRfx: NONREACTIVE

## 2018-05-12 LAB — STREP PNEUMONIAE URINARY ANTIGEN: Strep Pneumo Urinary Antigen: NEGATIVE

## 2018-05-12 MED ORDER — AMLODIPINE BESYLATE 5 MG PO TABS
10.0000 mg | ORAL_TABLET | Freq: Every day | ORAL | Status: DC
  Administered 2018-05-12 – 2018-05-13 (×2): 10 mg via ORAL
  Filled 2018-05-12 (×2): qty 2

## 2018-05-12 MED ORDER — DIPHENHYDRAMINE HCL 50 MG/ML IJ SOLN
INTRAMUSCULAR | Status: AC
  Administered 2018-05-12: 25 mg via INTRAVENOUS
  Filled 2018-05-12: qty 1

## 2018-05-12 MED ORDER — DIPHENHYDRAMINE HCL 50 MG/ML IJ SOLN
25.0000 mg | Freq: Once | INTRAMUSCULAR | Status: DC
  Filled 2018-05-12: qty 1

## 2018-05-12 MED ORDER — BUDESONIDE 0.25 MG/2ML IN SUSP
0.2500 mg | Freq: Two times a day (BID) | RESPIRATORY_TRACT | Status: DC
  Administered 2018-05-12 – 2018-05-13 (×2): 0.25 mg via RESPIRATORY_TRACT
  Filled 2018-05-12 (×2): qty 2

## 2018-05-12 NOTE — Progress Notes (Signed)
Mid level hasnt called. Paged mid level again. No new orders at this time. Will continue to monitor patient throughout shift.

## 2018-05-12 NOTE — Care Management Important Message (Signed)
Important Message  Patient Details  Name: Jose Scott MRN: [id number] Date of Birth: 29-Nov-1950   Medicare Important Message Given:  Yes    Corey Harold 05/12/2018, 4:27 PM

## 2018-05-12 NOTE — Progress Notes (Signed)
PROGRESS NOTE    Jose Scott  OEU:[id number] DOB: [birth year] DOA: 05/10/2018 PCP: Samuel Jester, DO    Brief Narrative:  68 year old male with a history of hypertension and BPH, admitted to the hospital with syncope.  He was noted to be dehydrated with acute kidney injury and found to have community-acquired pneumonia.  He was also noted to be hypoxic requiring supplemental oxygen.  Patient was started on IV fluids, intravenous antibiotics and supplemental oxygen.  Clinically he is improving.  Plan to discharge home once he is able to wean off of oxygen.   Assessment & Plan:   Active Problems:   Acute respiratory failure (HCC)   CAP (community acquired pneumonia)   Essential hypertension   BPH (benign prostatic hyperplasia)   AKI (acute kidney injury) (HCC)   1. Acute respiratory failure secondary to pneumonia.  Currently on 3 L of oxygen.  When he ambulated today on room air, oxygen saturations dipped down into the 80s.  Will reassess in a.m. 2. Community-acquired pneumonia.  Currently on Levaquin due to allergic reaction with ceftriaxone/azithromycin.  Continue on bronchodilators pulmonary hygiene.   3. Acute kidney injury.  Related to dehydration.  Improved with IV fluids. 4. Syncope.  Likely vasovagal in the setting of dehydration.  Hypoxia from pneumonia may have also played a role.  He has not had any recurrence. 5. H.  Continue on Flomax. 6. Hypertension.  Continue chlorthalidone.  Resume amlodipine currently on hold since blood pressures are stable. 7. Hypokalemia.  Replace   DVT prophylaxis: Heparin Code Status: Full code Family Communication: No family present Disposition Plan: Discharge home when he is able to ambulate off of oxygen   Consultants:     Procedures:     Antimicrobials:   Ceftriaxone 2/15 >  Azithromycin 2/15 >   Subjective: Patient had a allergic reaction overnight after he received 2 antibiotics.  He is unsure which one causes  hives/itching/swelling.  He received Benadryl with improvement of his symptoms.  Overall he feels his breathing is improving.  Objective: Vitals:   05/12/18 0533 05/12/18 0737 05/12/18 1429 05/12/18 1435  BP: (!) 150/75   140/72  Pulse: 70   68  Resp: 18   20  Temp: 98.1 F (36.7 C)   98.5 F (36.9 C)  TempSrc: Oral     SpO2: (!) 84% 93% 93% 98%  Weight:      Height:        Intake/Output Summary (Last 24 hours) at 05/12/2018 1757 Last data filed at 05/12/2018 0801 Gross per 24 hour  Intake 50.12 ml  Output 700 ml  Net -649.88 ml   Filed Weights   05/10/18 0905  Weight: 106.6 kg    Examination:  General exam: Appears calm and comfortable  Respiratory system: Occasional rhonchi, no wheezing. Respiratory effort normal. Cardiovascular system: S1 & S2 heard, RRR. No JVD, murmurs, rubs, gallops or clicks. No pedal edema. Gastrointestinal system: Abdomen is nondistended, soft and nontender. No organomegaly or masses felt. Normal bowel sounds heard. Central nervous system: Alert and oriented. No focal neurological deficits. Extremities: Symmetric 5 x 5 power. Skin: No rashes, lesions or ulcers Psychiatry: Judgement and insight appear normal. Mood & affect appropriate.     Data Reviewed: I have personally reviewed following labs and imaging studies  CBC: Recent Labs  Lab 05/10/18 0935  WBC 4.5  NEUTROABS 2.8  HGB 15.9  HCT 50.2  MCV 86.6  PLT 167   Basic Metabolic Panel: Recent Labs  Lab 05/10/18  0935 05/11/18 0637 05/12/18 0606  NA 132* 135 136  K 3.2* 3.2* 3.9  CL 92* 96* 97*  CO2 27 28 27   GLUCOSE 127* 99 119*  BUN 33* 18 17  CREATININE 1.54* 1.07 1.16  CALCIUM 8.3* 8.0* 8.7*   GFR: Estimated Creatinine Clearance: 76.7 mL/min (by C-G formula based on SCr of 1.16 mg/dL). Liver Function Tests: No results for input(s): AST, ALT, ALKPHOS, BILITOT, PROT, ALBUMIN in the last 168 hours. No results for input(s): LIPASE, AMYLASE in the last 168 hours. No  results for input(s): AMMONIA in the last 168 hours. Coagulation Profile: No results for input(s): INR, PROTIME in the last 168 hours. Cardiac Enzymes: No results for input(s): CKTOTAL, CKMB, CKMBINDEX, TROPONINI in the last 168 hours. BNP (last 3 results) No results for input(s): PROBNP in the last 8760 hours. HbA1C: No results for input(s): HGBA1C in the last 72 hours. CBG: No results for input(s): GLUCAP in the last 168 hours. Lipid Profile: No results for input(s): CHOL, HDL, LDLCALC, TRIG, CHOLHDL, LDLDIRECT in the last 72 hours. Thyroid Function Tests: No results for input(s): TSH, T4TOTAL, FREET4, T3FREE, THYROIDAB in the last 72 hours. Anemia Panel: No results for input(s): VITAMINB12, FOLATE, FERRITIN, TIBC, IRON, RETICCTPCT in the last 72 hours. Sepsis Labs: No results for input(s): PROCALCITON, LATICACIDVEN in the last 168 hours.  Recent Results (from the past 240 hour(s))  Culture, blood (routine x 2) Call MD if unable to obtain prior to antibiotics being given     Status: None (Preliminary result)   Collection Time: 05/10/18  6:32 PM  Result Value Ref Range Status   Specimen Description BLOOD RIGHT ARM  Final   Special Requests   Final    BOTTLES DRAWN AEROBIC AND ANAEROBIC Blood Culture results may not be optimal due to an excessive volume of blood received in culture bottles   Culture   Final    NO GROWTH 2 DAYS Performed at [facility], [address] [place], [place] [address]    Report Status PENDING  Incomplete  Culture, blood (routine x 2) Call MD if unable to obtain prior to antibiotics being given     Status: None (Preliminary result)   Collection Time: 05/10/18  6:45 PM  Result Value Ref Range Status   Specimen Description LEFT ANTECUBITAL  Final   Special Requests   Final    BOTTLES DRAWN AEROBIC AND ANAEROBIC Blood Culture adequate volume   Culture   Final    NO GROWTH 2 DAYS Performed at [facility], [address], [place], [place] [address]     Report Status PENDING  Incomplete         Radiology Studies: No results found.      Scheduled Meds: . chlorthalidone  25 mg Oral Daily  . guaiFENesin  1,200 mg Oral BID  . heparin  5,000 Units Subcutaneous Q8H  . ipratropium-albuterol  3 mL Nebulization Q6H  . mouth rinse  15 mL Mouth Rinse BID  . pneumococcal 23 valent vaccine  0.5 mL Intramuscular Tomorrow-1000  . potassium chloride SA  10 mEq Oral Daily  . tamsulosin  0.4 mg Oral Daily   Continuous Infusions: . famotidine (PEPCID) IV 20 mg (05/12/18 1000)  . levofloxacin (LEVAQUIN) IV       LOS: 2 days    Time spent:    Erick Blinks, MD [facility]   If 7PM-7AM, please contact night-coverage [email or web address]  05/12/2018, 5:57 PM

## 2018-05-12 NOTE — Progress Notes (Signed)
Patient called nurses station. Went into room. Patient stated he thinks he is having an allergic reaction to IV levaquin. Patient has redness and hives to arms. Stopped IV levaquin. Paged mid level. No benadryl ordered at this time. Will continue to monitor throughout shift.

## 2018-05-12 NOTE — Progress Notes (Addendum)
Mid level called back and placed order for IV  benadryl. Will continue to monitor throughout shift.

## 2018-05-12 NOTE — Progress Notes (Signed)
Patient has hives, welts and redness to arms, Abdomen and chest. This occurred  post antibiotic infusion of rocephin  and PO dosing of Zithromax.  mid level was immediately informed of above finding. New ordered were initiated

## 2018-05-12 NOTE — Progress Notes (Signed)
Patient given IV benadryl and solumedrol as ordered by provider. Redness and hives as dissipated greatly since dosing. Will continue to monitor patient closely as shift progresses

## 2018-05-13 MED ORDER — LEVOFLOXACIN 750 MG PO TABS: 750.0000 mg | ORAL_TABLET | Freq: Every day | ORAL | 0 refills | Status: DC

## 2018-05-13 MED ORDER — LEVOFLOXACIN 750 MG PO TABS
750.0000 mg | ORAL_TABLET | Freq: Every day | ORAL | Status: DC
  Administered 2018-05-13: 750 mg via ORAL
  Filled 2018-05-13: qty 1

## 2018-05-13 MED ORDER — FAMOTIDINE 20 MG PO TABS: 20.0000 mg | ORAL_TABLET | Freq: Two times a day (BID) | ORAL | Status: DC

## 2018-05-13 MED ORDER — GUAIFENESIN ER 600 MG PO TB12: 600.0000 mg | ORAL_TABLET | Freq: Two times a day (BID) | ORAL | 0 refills | Status: DC

## 2018-05-13 MED ORDER — ALBUTEROL SULFATE HFA 108 (90 BASE) MCG/ACT IN AERS: 2.0000 | INHALATION_SPRAY | Freq: Four times a day (QID) | RESPIRATORY_TRACT | 2 refills | Status: AC | PRN

## 2018-05-13 NOTE — Progress Notes (Signed)
Patient IV removed, telemetry removed, tolerated well.  

## 2018-05-13 NOTE — Discharge Summary (Signed)
Physician Discharge Summary  Jose DeutscherCharles C Scott UJW:[id number] DOB: 1950-04-21 DOA: 05/10/2018  PCP: Jose JesterButler, Jose Scott  Admit date: 05/10/2018 Discharge date: 05/13/2018  Admitted From: Home Disposition: Home  Recommendations for Outpatient Follow-up:  1. Follow up with PCP in 1-2 weeks 2. Please obtain BMP/CBC in one week 3. Repeat CT chest in 4 weeks to ensure resolution of pneumonia  Discharge Condition: Stable CODE STATUS: Full code Diet recommendation: Heart healthy  Brief/Interim Summary: 68 year old male with a history of hypertension BPH, initially presents to the hospital with an episode of syncope.  He was noted to be dehydrated and had acute kidney injury.  He was also found to have community-acquired pneumonia on CT imaging.  He was noted to be hypoxic and required supplemental oxygen.  Patient was treated with intravenous fluids, intravenous antibiotics, bronchodilator therapy and supplemental oxygen.  Overall respiratory status has improved and has been weaned off of oxygen.  He is able to ambulate on room air without difficulty.  He is not having any fevers.  Acute kidney injury has resolved and volume status is euvolemic now.  Patient has been transitioned to oral antibiotics to complete his course.  He will be prescribed an albuterol inhaler to be used as needed.  Of note, patient did have allergic reaction after receiving intravenous ceftriaxone/oral azithromycin/intravenous Levaquin.  After all these medications he developed hives.  He was given oral Levaquin and appeared to be tolerating this well.  He did not have any recurrence of rash/itching after taking oral antibiotics.  It is unclear whether he truly had an allergic reaction.  Patient is feeling better at this time and is anxious to discharge home.  Discharge Diagnoses:  Active Problems:   Acute respiratory failure (HCC)   CAP (community acquired pneumonia)   Essential hypertension   BPH (benign prostatic  hyperplasia)   AKI (acute kidney injury) [facility](HCC)    Discharge Instructions  Discharge Instructions    Diet - low sodium heart healthy   Complete by:  As directed    Increase activity slowly   Complete by:  As directed      Allergies as of 05/13/2018      Reactions   Codeine    Rocephin [ceftriaxone Sodium In Dextrose] Hives   Zithromax [azithromycin] Hives      Medication List    TAKE these medications   albuterol 108 (90 Base) MCG/ACT inhaler Commonly known as:  PROVENTIL HFA;VENTOLIN HFA Inhale 2 puffs into the lungs every 6 (six) hours as needed for wheezing or shortness of breath.   amLODipine-benazepril 10-20 MG capsule Commonly known as:  LOTREL Take 1 capsule by mouth daily.   aspirin 325 MG tablet Take 1 tablet by mouth daily as needed.   chlorthalidone 25 MG tablet Commonly known as:  HYGROTON Take 1 tablet by mouth daily.   Fish Oil 1000 MG Caps Take 1 capsule by mouth daily.   guaiFENesin 600 MG 12 hr tablet Commonly known as:  MUCINEX Take 1 tablet (600 mg total) by mouth 2 (two) times daily.   levofloxacin 750 MG tablet Commonly known as:  LEVAQUIN Take 1 tablet (750 mg total) by mouth daily. Start taking on:  May 14, 2018   potassium chloride 8 MEQ tablet Commonly known as:  KLOR-CON Take 1 tablet by mouth daily.   tamsulosin 0.4 MG Caps capsule Commonly known as:  FLOMAX Take 1 capsule by mouth daily.   tetrahydrozoline 0.05 % ophthalmic solution Place 2 drops into both eyes 2 (  two) times daily as needed.       Allergies  Allergen Reactions  . Codeine   . Rocephin [Ceftriaxone Sodium In Dextrose] Hives  . Zithromax [Azithromycin] Hives    Consultations:     Procedures/Studies: Dg Chest 2 View  Result Date: 05/10/2018 CLINICAL DATA:  Shortness of breath with cough and fever EXAM: CHEST - 2 VIEW COMPARISON:  None. FINDINGS: There is slight bibasilar atelectasis. The lungs elsewhere are clear. Heart size and pulmonary  vascularity are normal. No adenopathy. No bone lesions. IMPRESSION: Mild bibasilar atelectasis. No edema or consolidation. Heart size within normal limits. Electronically Signed   By: Bretta Bang III M.D.   On: 05/10/2018 10:09   Ct Angio Chest Pe W/cm &/or Wo Cm  Result Date: 05/10/2018 CLINICAL DATA:  Cough and fever.  Body aches. EXAM: CT ANGIOGRAPHY CHEST WITH CONTRAST TECHNIQUE: Multidetector CT imaging of the chest was performed using the standard protocol during bolus administration of intravenous contrast. Multiplanar CT image reconstructions and MIPs were obtained to evaluate the vascular anatomy. CONTRAST:  <See Chart> ISOVUE-370 IOPAMIDOL (ISOVUE-370) INJECTION 76% COMPARISON:  Radiograph 05/10/2018 FINDINGS: Cardiovascular: No filling defects within the pulmonary arteries to suggest acute pulmonary embolism. No acute findings of the aorta or great vessels. No pericardial fluid. Mediastinum/Nodes: No axillary supraclavicular adenopathy. No mediastinal hilar adenopathy. No pericardial effusion. Esophagus normal. Lungs/Pleura: Paraseptal emphysema in the lung apices. There is bibasilar peribronchial airway thickening and mild consolidation (image 100/6). Similar findings in the inferior lingula. No measurable nodularity. Upper Abdomen: Limited view of the liver, kidneys, pancreas are unremarkable. Normal adrenal glands. Musculoskeletal: No aggressive osseous lesion. Review of the MIP images confirms the above findings. IMPRESSION: 1. No evidence acute pulmonary embolism. 2. Peribronchial thickening with mild consolidation in lower lobes suggests bibasilar pneumonia versus aspiration pneumonitis. Electronically Signed   By: Genevive Bi M.D.   On: 05/10/2018 16:08       Subjective:  Feeling better.  Shortness of breath is better.  Cough is minimal.  Able to ambulate on room air without difficulty.  Discharge Exam: Vitals:   05/12/18 2207 05/13/18 0550 05/13/18 0738 05/13/18 1427   BP: (!) 149/75 (!) 156/74  138/82  Pulse: 83 81  98  Resp: 18 16  18   Temp: 98.1 F (36.7 C) 98.5 F (36.9 C)    TempSrc: Oral Oral    SpO2: 94% 93% 94% 93%  Weight:      Height:        General: Pt is alert, awake, not in acute distress Cardiovascular: RRR, S1/S2 +, no rubs, no gallops Respiratory: CTA bilaterally, no wheezing, no rhonchi Abdominal: Soft, NT, ND, bowel sounds + Extremities: no edema, no cyanosis    The results of significant diagnostics from this hospitalization (including imaging, microbiology, ancillary and laboratory) are listed below for reference.     Microbiology: Recent Results (from the past 240 hour(s))  Culture, blood (routine x 2) Call MD if unable to obtain prior to antibiotics being given     Status: None (Preliminary result)   Collection Time: 05/10/18  6:32 PM  Result Value Ref Range Status   Specimen Description BLOOD RIGHT ARM  Final   Special Requests   Final    BOTTLES DRAWN AEROBIC AND ANAEROBIC Blood Culture results may not be optimal due to an excessive volume of blood received in culture bottles   Culture   Final    NO GROWTH 3 DAYS Performed at [facility], [address] [place],  [place] [address]    Report Status PENDING  Incomplete  Culture, blood (routine x 2) Call MD if unable to obtain prior to antibiotics being given     Status: None (Preliminary result)   Collection Time: 05/10/18  6:45 PM  Result Value Ref Range Status   Specimen Description LEFT ANTECUBITAL  Final   Special Requests   Final    BOTTLES DRAWN AEROBIC AND ANAEROBIC Blood Culture adequate volume   Culture   Final    NO GROWTH 3 DAYS Performed at [facility], [address] [place], [place] [address]    Report Status PENDING  Incomplete     Labs: BNP (last 3 results) No results for input(s): BNP in the last 8760 hours. Basic Metabolic Panel: Recent Labs  Lab 05/10/18 0935 05/11/18 0637 05/12/18 0606  NA 132* 135 136  K 3.2* 3.2* 3.9  CL 92*  96* 97*  CO2 27 28 27   GLUCOSE 127* 99 119*  BUN 33* 18 17  CREATININE 1.54* 1.07 1.16  CALCIUM 8.3* 8.0* 8.7*   Liver Function Tests: No results for input(s): AST, ALT, ALKPHOS, BILITOT, PROT, ALBUMIN in the last 168 hours. No results for input(s): LIPASE, AMYLASE in the last 168 hours. No results for input(s): AMMONIA in the last 168 hours. CBC: Recent Labs  Lab 05/10/18 0935  WBC 4.5  NEUTROABS 2.8  HGB 15.9  HCT 50.2  MCV 86.6  PLT 167   Cardiac Enzymes: No results for input(s): CKTOTAL, CKMB, CKMBINDEX, TROPONINI in the last 168 hours. BNP: Invalid input(s): POCBNP CBG: No results for input(s): GLUCAP in the last 168 hours. D-Dimer No results for input(s): DDIMER in the last 72 hours. Hgb A1c No results for input(s): HGBA1C in the last 72 hours. Lipid Profile No results for input(s): CHOL, HDL, LDLCALC, TRIG, CHOLHDL, LDLDIRECT in the last 72 hours. Thyroid function studies No results for input(s): TSH, T4TOTAL, T3FREE, THYROIDAB in the last 72 hours.  Invalid input(s): FREET3 Anemia work up No results for input(s): VITAMINB12, FOLATE, FERRITIN, TIBC, IRON, RETICCTPCT in the last 72 hours. Urinalysis No results found for: COLORURINE, APPEARANCEUR, LABSPEC, PHURINE, GLUCOSEU, HGBUR, BILIRUBINUR, KETONESUR, PROTEINUR, UROBILINOGEN, NITRITE, LEUKOCYTESUR Sepsis Labs Invalid input(s): PROCALCITONIN,  WBC,  LACTICIDVEN Microbiology Recent Results (from the past 240 hour(s))  Culture, blood (routine x 2) Call MD if unable to obtain prior to antibiotics being given     Status: None (Preliminary result)   Collection Time: 05/10/18  6:32 PM  Result Value Ref Range Status   Specimen Description BLOOD RIGHT ARM  Final   Special Requests   Final    BOTTLES DRAWN AEROBIC AND ANAEROBIC Blood Culture results may not be optimal due to an excessive volume of blood received in culture bottles   Culture   Final    NO GROWTH 3 DAYS Performed at [facility], [address] [place], [place] [address]    Report Status PENDING  Incomplete  Culture, blood (routine x 2) Call MD if unable to obtain prior to antibiotics being given     Status: None (Preliminary result)   Collection Time: 05/10/18  6:45 PM  Result Value Ref Range Status   Specimen Description LEFT ANTECUBITAL  Final   Special Requests   Final    BOTTLES DRAWN AEROBIC AND ANAEROBIC Blood Culture adequate volume   Culture   Final    NO GROWTH 3 DAYS Performed at [facility], [address] [place], [place] [address]    Report Status  PENDING  Incomplete     Time coordinating discharge:  SIGNED:   Erick Blinks, MD  [facility] 05/13/2018, 3:51 PM   If 7PM-7AM, please contact night-coverage [email or web address]

## 2018-05-13 NOTE — Progress Notes (Signed)
Patients hives and redness has resolved. Patient received IV pepcid at 2259 and tolerated well with no adverse reaction. Patient now is resting in bed with eyes closed. Breathing is even and nonlabored. Will continue to monitor throughout shift.

## 2018-05-15 LAB — CULTURE, BLOOD (ROUTINE X 2)
Culture: NO GROWTH
Culture: NO GROWTH
Special Requests: ADEQUATE

## 2019-01-09 ENCOUNTER — Encounter (HOSPITAL_COMMUNITY): Payer: Self-pay | Admitting: Emergency Medicine

## 2019-01-09 ENCOUNTER — Emergency Department (HOSPITAL_COMMUNITY): Payer: Medicare Other

## 2019-01-09 ENCOUNTER — Emergency Department (HOSPITAL_COMMUNITY)
Admission: EM | Admit: 2019-01-09 | Discharge: 2019-01-09 | Disposition: A | Discharge status: Home / Self Care | Payer: Medicare Other | Attending: Emergency Medicine | Admitting: Emergency Medicine

## 2019-01-09 ENCOUNTER — Other Ambulatory Visit: Payer: Self-pay

## 2019-01-09 DIAGNOSIS — Z7982 Long term (current) use of aspirin: Secondary | ICD-10-CM | POA: Diagnosis not present

## 2019-01-09 DIAGNOSIS — Z87891 Personal history of nicotine dependence: Secondary | ICD-10-CM | POA: Insufficient documentation

## 2019-01-09 DIAGNOSIS — E876 Hypokalemia: Secondary | ICD-10-CM | POA: Diagnosis not present

## 2019-01-09 DIAGNOSIS — J069 Acute upper respiratory infection, unspecified: Secondary | ICD-10-CM | POA: Diagnosis not present

## 2019-01-09 DIAGNOSIS — E860 Dehydration: Secondary | ICD-10-CM | POA: Diagnosis not present

## 2019-01-09 DIAGNOSIS — Z79899 Other long term (current) drug therapy: Secondary | ICD-10-CM | POA: Diagnosis not present

## 2019-01-09 DIAGNOSIS — R05 Cough: Secondary | ICD-10-CM | POA: Diagnosis present

## 2019-01-09 DIAGNOSIS — I10 Essential (primary) hypertension: Secondary | ICD-10-CM | POA: Diagnosis not present

## 2019-01-09 DIAGNOSIS — U071 COVID-19: Secondary | ICD-10-CM | POA: Insufficient documentation

## 2019-01-09 HISTORY — DX: Pneumonia, unspecified organism: J18.9

## 2019-01-09 LAB — COMPREHENSIVE METABOLIC PANEL
ALT: 14 U/L (ref 0–44)
AST: 21 U/L (ref 15–41)
Albumin: 3.7 g/dL (ref 3.5–5.0)
Alkaline Phosphatase: 80 U/L (ref 38–126)
Anion gap: 13 (ref 5–15)
BUN: 24 mg/dL — ABNORMAL HIGH (ref 8–23)
CO2: 25 mmol/L (ref 22–32)
Calcium: 8.1 mg/dL — ABNORMAL LOW (ref 8.9–10.3)
Chloride: 94 mmol/L — ABNORMAL LOW (ref 98–111)
Creatinine, Ser: 1.34 mg/dL — ABNORMAL HIGH (ref 0.61–1.24)
GFR calc Af Amer: >60 mL/min (ref >60)
GFR calc non Af Amer: 54 mL/min — ABNORMAL LOW (ref >60)
Glucose, Bld: 116 mg/dL — ABNORMAL HIGH (ref 70–99)
Potassium: 2.8 mmol/L — ABNORMAL LOW (ref 3.5–5.1)
Sodium: 132 mmol/L — ABNORMAL LOW (ref 135–145)
Total Bilirubin: 0.5 mg/dL (ref 0.3–1.2)
Total Protein: 7.8 g/dL (ref 6.5–8.1)

## 2019-01-09 LAB — CBC WITH DIFFERENTIAL/PLATELET
Abs Immature Granulocytes: 0.01 10*3/uL (ref 0.00–0.07)
Basophils Absolute: 0 10*3/uL (ref 0.0–0.1)
Basophils Relative: 1 %
Eosinophils Absolute: 0 10*3/uL (ref 0.0–0.5)
Eosinophils Relative: 0 %
HCT: 47.5 % (ref 39.0–52.0)
Hemoglobin: 15.3 g/dL (ref 13.0–17.0)
Immature Granulocytes: 0 %
Lymphocytes Relative: 18 %
Lymphs Abs: 0.6 10*3/uL — ABNORMAL LOW (ref 0.7–4.0)
MCH: 27.8 pg (ref 26.0–34.0)
MCHC: 32.2 g/dL (ref 30.0–36.0)
MCV: 86.4 fL (ref 80.0–100.0)
Monocytes Absolute: 0.6 10*3/uL (ref 0.1–1.0)
Monocytes Relative: 19 %
Neutro Abs: 2.1 10*3/uL (ref 1.7–7.7)
Neutrophils Relative %: 62 %
Platelets: 166 10*3/uL (ref 150–400)
RBC: 5.5 MIL/uL (ref 4.22–5.81)
RDW: 13.3 % (ref 11.5–15.5)
WBC: 3.4 10*3/uL — ABNORMAL LOW (ref 4.0–10.5)
nRBC: 0 % (ref 0.0–0.2)

## 2019-01-09 LAB — SARS CORONAVIRUS 2 (TAT 6-24 HRS): SARS Coronavirus 2: POSITIVE — AB

## 2019-01-09 LAB — MAGNESIUM: Magnesium: 2.4 mg/dL (ref 1.7–2.4)

## 2019-01-09 MED ORDER — ALBUTEROL SULFATE HFA 108 (90 BASE) MCG/ACT IN AERS: 2.0000 | INHALATION_SPRAY | Freq: Once | RESPIRATORY_TRACT | Status: DC

## 2019-01-09 MED ORDER — SODIUM CHLORIDE 0.9 % IV BOLUS
1000.0000 mL | Freq: Once | INTRAVENOUS | Status: AC
  Administered 2019-01-09: 1000 mL via INTRAVENOUS

## 2019-01-09 MED ORDER — POTASSIUM CHLORIDE CRYS ER 20 MEQ PO TBCR
40.0000 meq | EXTENDED_RELEASE_TABLET | Freq: Once | ORAL | Status: AC
  Administered 2019-01-09: 40 meq via ORAL
  Filled 2019-01-09: qty 2

## 2019-01-09 MED ORDER — POTASSIUM CHLORIDE CRYS ER 20 MEQ PO TBCR: EXTENDED_RELEASE_TABLET | ORAL | 0 refills | Status: AC

## 2019-01-09 MED ORDER — IPRATROPIUM BROMIDE HFA 17 MCG/ACT IN AERS: 2.0000 | INHALATION_SPRAY | Freq: Once | RESPIRATORY_TRACT | Status: DC

## 2019-01-09 MED ORDER — IPRATROPIUM-ALBUTEROL 20-100 MCG/ACT IN AERS
2.0000 | INHALATION_SPRAY | Freq: Once | RESPIRATORY_TRACT | Status: AC
  Administered 2019-01-09: 2 via RESPIRATORY_TRACT
  Filled 2019-01-09: qty 4

## 2019-01-09 NOTE — ED Provider Notes (Signed)
[facility] EMERGENCY DEPARTMENT Provider Note   CSN: [id number] Arrival date & time: 01/09/19  9924     History   Chief Complaint Chief Complaint  Patient presents with  . Cough    HPI Jose Scott is a 68 y.o. male history of hypertension presents for cough, congestion, body aches and fatigue.  Patient states he has had cough, congestion, body aches, fatigue for 6 days states that he was came in today because his wife told him to.  Patient states that symptoms are constant and unremitting unchanged since onset.  Patient has taken OTC medications with no relief.  Has been using sinus spray which is improved his congestion some but still states that he feels tired.  Patient states he was hospitalized in February for pneumonia and feels similar to how he felt at that time.  States that he has been coughing up small amounts of sputum.   Denies shortness of breath but feels generally tired, no chest pain, no headache, no dizziness.      HPI  Past Medical History:  Diagnosis Date  . Enlarged prostate   . Hypertension   . Pneumonia     Patient Active Problem List   Diagnosis Date Noted  . Acute respiratory failure (HCC) 05/10/2018  . CAP (community acquired pneumonia) 05/10/2018  . Essential hypertension 05/10/2018  . BPH (benign prostatic hyperplasia) 05/10/2018  . AKI (acute kidney injury) (HCC) 05/10/2018    Past Surgical History:  Procedure Laterality Date  . CATARACT EXTRACTION    . PROSTATE BIOPSY          Home Medications    Prior to Admission medications   Medication Sig Start Date End Date Taking? Authorizing Provider  acetaminophen (TYLENOL) 500 MG tablet Take 500 mg by mouth every 6 (six) hours as needed for fever.   Yes [provider]  albuterol (PROVENTIL HFA;VENTOLIN HFA) 108 (90 Base) MCG/ACT inhaler Inhale 2 puffs into the lungs every 6 (six) hours as needed for wheezing or shortness of breath. 05/13/18  Yes Erick Blinks, MD   amLODipine-benazepril (LOTREL) 10-20 MG capsule Take 1 capsule by mouth daily. 03/01/18  Yes [provider]  aspirin 325 MG tablet Take 1 tablet by mouth daily as needed.    Yes [provider]  chlorthalidone (HYGROTON) 25 MG tablet Take 1 tablet by mouth daily. 05/07/18  Yes [provider]  fluticasone Aleda Grana) 50 MCG/ACT nasal spray  01/08/19  Yes [provider]  guaiFENesin (MUCINEX) 600 MG 12 hr tablet Take 1 tablet (600 mg total) by mouth 2 (two) times daily. 05/13/18  Yes Erick Blinks, MD  Omega-3 Fatty Acids (FISH OIL) 1000 MG CAPS Take 1 capsule by mouth daily.   Yes [provider]  tamsulosin (FLOMAX) 0.4 MG CAPS capsule Take 1 capsule by mouth daily. 01/02/18  Yes [provider]  tetrahydrozoline 0.05 % ophthalmic solution Place 2 drops into both eyes 2 (two) times daily as needed.   Yes [provider]  levofloxacin (LEVAQUIN) 750 MG tablet Take 1 tablet (750 mg total) by mouth daily. Patient not taking: Reported on 01/09/2019 05/14/18   Erick Blinks, MD  potassium chloride (KLOR-CON) 8 MEQ tablet Take 1 tablet by mouth daily. 12/10/17   [provider]  potassium chloride SA (KLOR-CON) 20 MEQ tablet Take 1 tablet (20 mEq total) by mouth 2 (two) times daily for 7 days, THEN 1 tablet (20 mEq total) daily for 7 days. 01/09/19 01/23/19  Gailen Shelter,  PA    Family History Family History  Problem Relation Age of Onset  . Hypertension Other     Social History Social History   Tobacco Use  . Smoking status: Former Smoker    Types: Cigarettes    Quit date: 05/10/2002    Years since quitting: 16.6  . Smokeless tobacco: Never Used  Substance Use Topics  . Alcohol use: Never    Frequency: Never  . Drug use: Never     Allergies   Codeine, Rocephin [ceftriaxone sodium in dextrose], and Zithromax [azithromycin]   Review of Systems Review of Systems  Constitutional: Positive for fatigue. Negative  for fever.  HENT: Positive for congestion and sinus pressure. Negative for sore throat.   Cardiovascular: Negative for chest pain and leg swelling.  Gastrointestinal: Negative for vomiting.  Genitourinary: Negative for urgency.  Musculoskeletal: Positive for myalgias.  Skin: Negative for rash.  Neurological: Negative for dizziness.     Physical Exam Updated Vital Signs BP 124/68 (BP Location: Left Arm)   Pulse 71   Temp 99.1 F (37.3 C) (Oral)   Resp 14   Ht  (1.803 m)   Wt 108.9 kg   SpO2 94%   BMI 33.47 kg/m   Physical Exam Vitals signs and nursing note reviewed.  Constitutional:      General: He is not in acute distress.    Comments: Patient appears tired but alert, answering questions, following commands  HENT:     Head: Normocephalic and atraumatic.     Nose: Congestion present.     Mouth/Throat:     Mouth: Mucous membranes are moist.  Eyes:     General: No scleral icterus. Neck:     Musculoskeletal: Normal range of motion.  Cardiovascular:     Rate and Rhythm: Normal rate and regular rhythm.     Pulses: Normal pulses.     Heart sounds: Normal heart sounds.  Pulmonary:     Effort: Pulmonary effort is normal. No respiratory distress.     Breath sounds: Normal breath sounds. No wheezing or rhonchi.     Comments: Normal effort, speaking full sentences, no accessory muscle usage. Chest:     Chest wall: No tenderness.  Abdominal:     Palpations: Abdomen is soft.     Tenderness: There is no abdominal tenderness.  Musculoskeletal:     Right lower leg: No edema.     Left lower leg: No edema.  Skin:    General: Skin is warm and dry.     Capillary Refill: Capillary refill takes less than 2 seconds.  Neurological:     Mental Status: He is alert. Mental status is at baseline.  Psychiatric:        Mood and Affect: Mood normal.        Behavior: Behavior normal.      ED Treatments / Results  Labs (all labs ordered are listed, but only abnormal results  are displayed) Labs Reviewed  COMPREHENSIVE METABOLIC PANEL - Abnormal; Notable for the following components:      Result Value   Sodium 132 (*)    Potassium 2.8 (*)    Chloride 94 (*)    Glucose, Bld 116 (*)    BUN 24 (*)    Creatinine, Ser 1.34 (*)    Calcium 8.1 (*)    GFR calc non Af Amer 54 (*)    All other components within normal limits  CBC WITH DIFFERENTIAL/PLATELET - Abnormal; Notable for the following components:  WBC 3.4 (*)    Lymphs Abs 0.6 (*)    All other components within normal limits  SARS CORONAVIRUS 2 (TAT 6-24 HRS)  MAGNESIUM    EKG EKG Interpretation  Date/Time:  Friday January 09 2019 14:08:28 EDT Ventricular Rate:  69 PR Interval:    QRS Duration: 95 QT Interval:  410 QTC Calculation: 440 R Axis:   -27 Text Interpretation:  Sinus rhythm Borderline left axis deviation No significant change since last tracing Confirmed by Lajean Saver [id number]) on 01/09/2019 2:19:52 PM   Radiology Dg Chest Port 1 View  Result Date: 01/09/2019 CLINICAL DATA:  Productive cough, chills, and body aches for 5 days. EXAM: PORTABLE CHEST 1 VIEW COMPARISON:  05/10/2018 FINDINGS: The heart size and mediastinal contours are within normal limits. Mild bibasilar scarring remains stable. No evidence of acute infiltrate or edema. No evidence of pleural effusion or pneumothorax. IMPRESSION: Stable bibasilar scarring. No active disease. Electronically Signed   By: Marlaine Hind M.D.   On: 01/09/2019 11:30    Procedures Procedures (including critical care time)  Medications Ordered in ED Medications  sodium chloride 0.9 % bolus 1,000 mL (1,000 mLs Intravenous New Bag/Given 01/09/19 1209)  Ipratropium-Albuterol (COMBIVENT) respimat 2 puff (2 puffs Inhalation Given 01/09/19 1310)  potassium chloride SA (KLOR-CON) CR tablet 40 mEq (40 mEq Oral Given 01/09/19 1209)     Initial Impression / Assessment and Plan / ED Course  I have reviewed the triage vital signs and the nursing  notes.  Pertinent labs & imaging results that were available during my care of the patient were reviewed by me and considered in my medical decision making (see chart for details).        CBC, CMP, chest x-ray  Albuterol, Atrovent, 1 L normal saline  Plan to ambulate discharge if O2 sat stable.  Potassium of 2.8 given oral potassium.  Patient is currently on chlorthalidone; was discharged from hospital admission in February on oral potassium but discontinued when normal values were found outpatient.  Patient has mild AKI with BUN 24 and creat 1.34 likely prerenal due to decreased oral intake. IVF given.  Patient states he feels much improved.  Patient ambulated without difficulty on room air SPO2 92 to 93%.  Chest x-ray with no acute process.  WBC mildly low likely due to viral process.  Discussed with patient plan to discharge home with follow-up with primary care within a week.  Patient will be discharged with 20 mEq twice daily potassium for 1 week and then an additional week of 20 mg daily.  Patient given Covid precautions as send out lab is still pending.  Given strict return precautions for worsening shortness of breath or other new or concerning symptoms.  Discussed case with Dr. Ashok Cordia who assessed patient at bedside.  All aspects of plan discussed with attending. EKG without concerning features such as ST changes or conduction abnormalities or U waves.   2:22 PM reassessed patient.  Patient feels much improved from original presentation.  SPO2 at 94% on room air temperature 98.6 on my recheck.  Patient appears well no increased work of breathing no shortness of breath no need for further work-up at this time.    Final Clinical Impressions(s) / ED Diagnoses   Final diagnoses:  Viral upper respiratory tract infection  Hypokalemia  Dehydration    ED Discharge Orders         Ordered    potassium chloride SA (KLOR-CON) 20 MEQ tablet     01/09/19  [address]           Solon [place],  Anoop Hemmer [place] 01/09/19 1425    Cathren LaineSteinl, Kevin, MD 01/09/19 1534

## 2019-01-09 NOTE — ED Notes (Signed)
Attempted to ambulate patient around the room. Oxygen saturation at rest is 87% room air. Notified Child psychotherapist.

## 2019-01-09 NOTE — ED Notes (Signed)
Abigail Butts, RT notified of need for Combivent inhaler.

## 2019-01-09 NOTE — ED Notes (Signed)
Patient given water at this time.  

## 2019-01-09 NOTE — Discharge Instructions (Addendum)
Please drink plenty of fluids, use potassium supplement as prescribed.  Please follow-up with primary care within 1 week.  Please return if you have any new or concerning symptoms including but not limited to worsening shortness of breath.  If you have any difficulty breathing to the ED immediately.  Please follow CDC guidelines to quarantine and to minimize contact with others as your Covid test is pending.

## 2019-01-09 NOTE — ED Triage Notes (Signed)
PT c/o generalized weakness, productive cough, chills and body aches x5 days. PT states he had pneumonia 04/2018 and feels the same way. PT 87% on room air and no oxygen use at home.

## 2019-01-09 NOTE — ED Notes (Signed)
Informed pt via phone call that his Covid test is positive.  Informed patient of guidelines for quarantine, follow up with phone call to his pmd, return to e.r. if symptoms worsen.  Pt also informed of quidelines for at home care, treating symptoms, staying home, washing hands, wearing mask at all times.   Pt verbalized understanding of all, and had no questions.

## 2019-01-21 ENCOUNTER — Other Ambulatory Visit: Payer: Self-pay

## 2019-01-21 DIAGNOSIS — Z20822 Contact with and (suspected) exposure to covid-19: Secondary | ICD-10-CM

## 2019-01-22 LAB — NOVEL CORONAVIRUS, NAA: SARS-CoV-2, NAA: DETECTED — AB

## 2021-06-02 IMAGING — CR DG CHEST 1V PORT
1 series · 1 of 1 positions shown · non-contrast
Comparison: 05/10/2018

CLINICAL DATA: Productive cough, chills, and body aches for 5 days.

EXAM:
PORTABLE CHEST 1 VIEW

[portable]
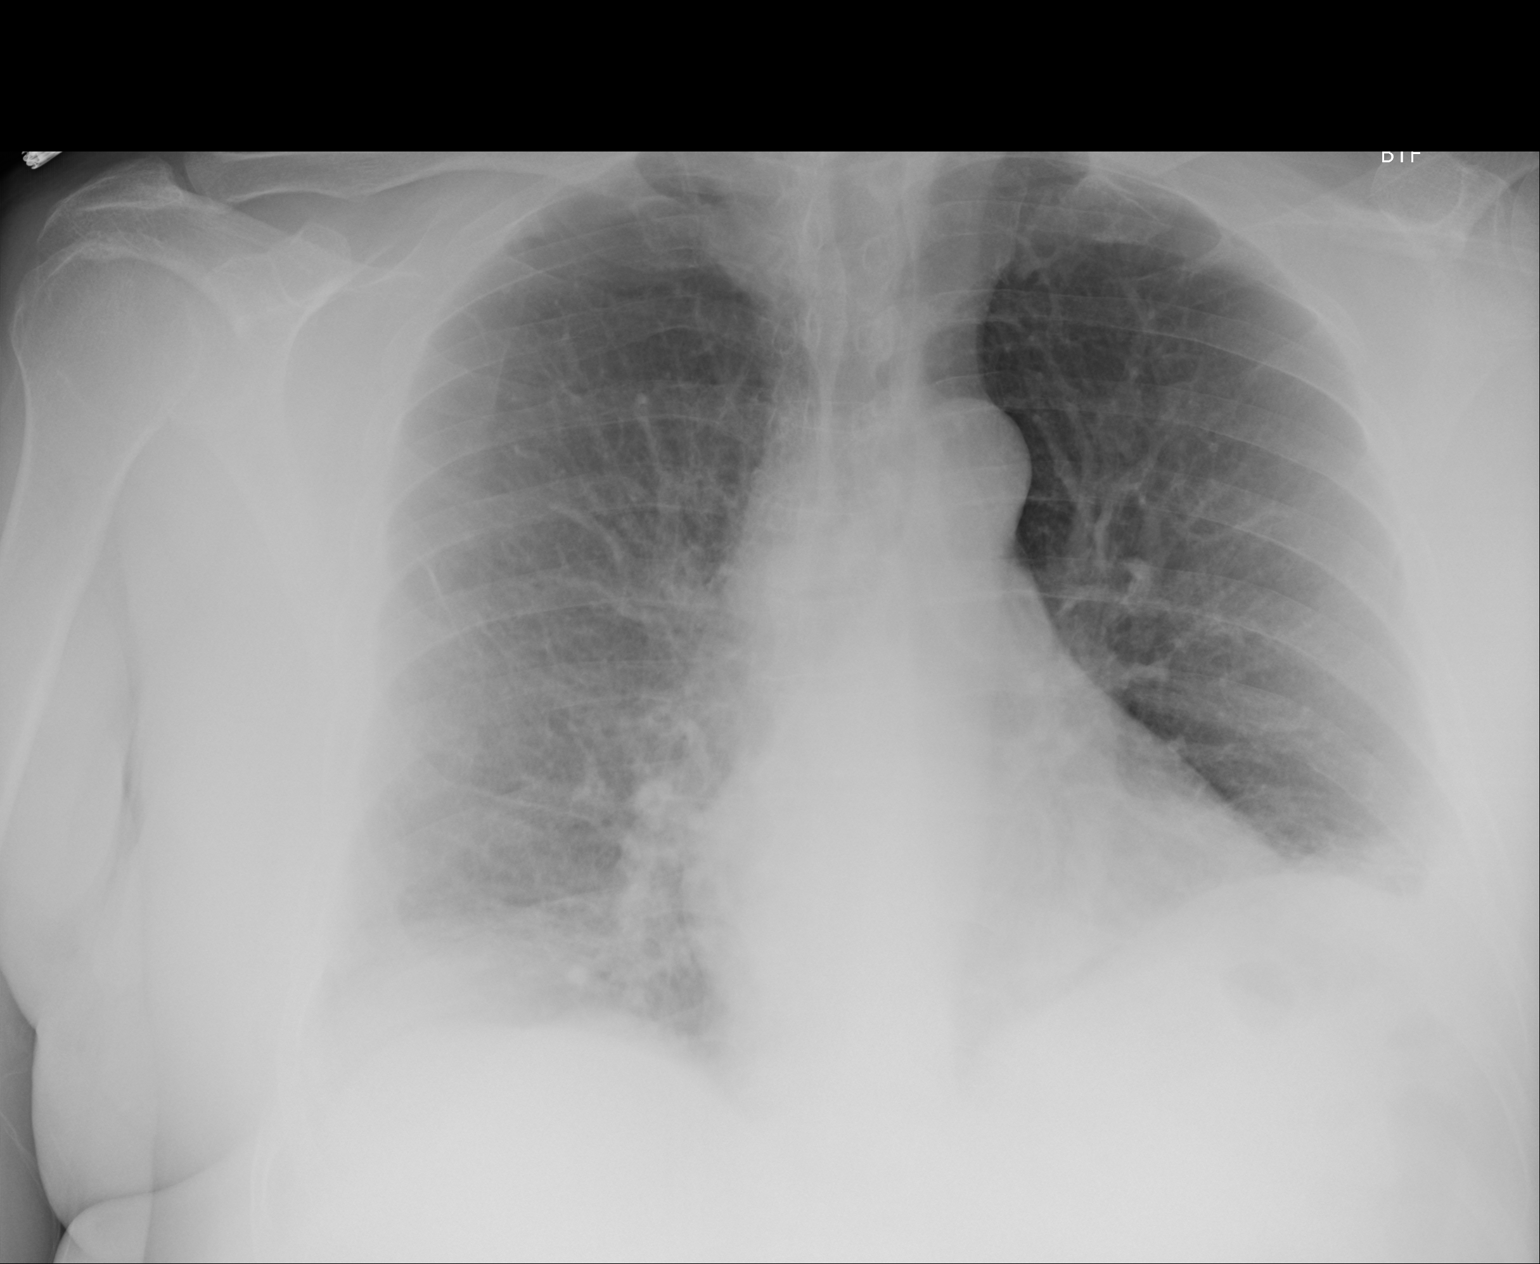

[1 of 1 positions shown; findings below may reference images not displayed]

FINDINGS: The heart size and mediastinal contours are within normal limits.
Mild bibasilar scarring remains stable. No evidence of acute
infiltrate or edema. No evidence of pleural effusion or
pneumothorax.
IMPRESSION: Stable bibasilar scarring. No active disease.

## 2023-01-15 ENCOUNTER — Other Ambulatory Visit: Payer: Self-pay

## 2023-01-15 ENCOUNTER — Emergency Department (HOSPITAL_COMMUNITY): Payer: Medicare PPO

## 2023-01-15 ENCOUNTER — Emergency Department (HOSPITAL_COMMUNITY)
Admission: EM | Admit: 2023-01-15 | Discharge: 2023-01-15 | Disposition: A | Discharge status: Home / Self Care | Payer: TRICARE For Life (TFL) | Attending: Emergency Medicine | Admitting: Emergency Medicine

## 2023-01-15 ENCOUNTER — Encounter (HOSPITAL_COMMUNITY): Payer: Self-pay | Admitting: Emergency Medicine

## 2023-01-15 DIAGNOSIS — I10 Essential (primary) hypertension: Secondary | ICD-10-CM | POA: Insufficient documentation

## 2023-01-15 DIAGNOSIS — Z87891 Personal history of nicotine dependence: Secondary | ICD-10-CM | POA: Diagnosis not present

## 2023-01-15 DIAGNOSIS — K625 Hemorrhage of anus and rectum: Secondary | ICD-10-CM | POA: Insufficient documentation

## 2023-01-15 LAB — CBC
HCT: 40.8 % (ref 39.0–52.0)
Hemoglobin: 13.3 g/dL (ref 13.0–17.0)
MCH: 29.1 pg (ref 26.0–34.0)
MCHC: 32.6 g/dL (ref 30.0–36.0)
MCV: 89.3 fL (ref 80.0–100.0)
Platelets: 209 10*3/uL (ref 150–400)
RBC: 4.57 MIL/uL (ref 4.22–5.81)
RDW: 13.7 % (ref 11.5–15.5)
WBC: 5.6 10*3/uL (ref 4.0–10.5)
nRBC: 0 % (ref 0.0–0.2)

## 2023-01-15 LAB — COMPREHENSIVE METABOLIC PANEL
ALT: 13 U/L (ref 0–44)
AST: 16 U/L (ref 15–41)
Albumin: 3.7 g/dL (ref 3.5–5.0)
Alkaline Phosphatase: 61 U/L (ref 38–126)
Anion gap: 9 (ref 5–15)
BUN: 13 mg/dL (ref 8–23)
CO2: 27 mmol/L (ref 22–32)
Calcium: 8.7 mg/dL — ABNORMAL LOW (ref 8.9–10.3)
Chloride: 102 mmol/L (ref 98–111)
Creatinine, Ser: 1.26 mg/dL — ABNORMAL HIGH (ref 0.61–1.24)
GFR, Estimated: >60 mL/min (ref >60)
Glucose, Bld: 128 mg/dL — ABNORMAL HIGH (ref 70–99)
Potassium: 2.9 mmol/L — ABNORMAL LOW (ref 3.5–5.1)
Sodium: 138 mmol/L (ref 135–145)
Total Bilirubin: 0.4 mg/dL (ref 0.3–1.2)
Total Protein: 7.2 g/dL (ref 6.5–8.1)

## 2023-01-15 LAB — TYPE AND SCREEN
ABO/RH(D): A POS
Antibody Screen: NEGATIVE

## 2023-01-15 LAB — LIPASE, BLOOD: Lipase: 24 U/L (ref 11–51)

## 2023-01-15 LAB — PROTIME-INR
INR: 1 (ref 0.8–1.2)
Prothrombin Time: 13.1 s (ref 11.4–15.2)

## 2023-01-15 MED ORDER — POLYETHYLENE GLYCOL 3350 17 G PO PACK: 17.0000 g | PACK | Freq: Every day | ORAL | 1 refills | Status: AC

## 2023-01-15 MED ORDER — OMEPRAZOLE 20 MG PO CPDR: 20.0000 mg | DELAYED_RELEASE_CAPSULE | Freq: Every day | ORAL | 1 refills | Status: DC

## 2023-01-15 NOTE — ED Notes (Signed)
Pt placed on 2L O2 

## 2023-01-15 NOTE — ED Triage Notes (Addendum)
Pt c/o blood in stool that started last night and today. Last night and today stated that color was red in color on stool. Pt had diarrhea around 2300 that was dark in color. Pt has a hx of GI ulcers and states symptoms are similar. Denies any f/n/v.  Pt ambulatory and in no distress at this time. Pt does not wear O2 however O2 during triage bounced between 89-93%

## 2023-01-15 NOTE — Discharge Instructions (Signed)
You were evaluated in the Emergency Department and after careful evaluation, we did not find any emergent condition requiring admission or further testing in the hospital.  Your exam/testing today is overall reassuring.  Take the omeprazole medication daily to decrease your stomach acid.  Use the MiraLAX 1 or 2 times daily to avoid firm stools.  Follow-up with your primary care doctor and/or gastroenterologist.  Please return to the Emergency Department if you experience any worsening of your condition.   Thank you for allowing [place] to be a part of your care.

## 2023-01-15 NOTE — ED Provider Notes (Signed)
AP-EMERGENCY DEPT [facility] Emergency Department Provider Note MRN:  [id number]  Arrival date & time: 01/15/23     Chief Complaint   Rectal Bleeding   History of Present Illness   Jose Scott is a 72 y.o. year-old male with a history of hypertension presenting to the ED with chief complaint of rectal bleeding.  3 episodes of rectal bleeding over the past 2 or 3 days.  Initially bright red, was a bit dark red this evening.  No abdominal pain, no fever, no vomiting blood.  Review of Systems  A thorough review of systems was obtained and all systems are negative except as noted in the HPI and PMH.   Patient's Health History    Past Medical History:  Diagnosis Date   Enlarged prostate    Hypertension    Pneumonia     Past Surgical History:  Procedure Laterality Date   CATARACT EXTRACTION     PROSTATE BIOPSY      Family History  Problem Relation Age of Onset   Hypertension Other     Social History   Socioeconomic History   Marital status: Married    Spouse name: Not on file   Number of children: Not on file   Years of education: Not on file   Highest education level: Not on file  Occupational History   Not on file  Tobacco Use   Smoking status: Former    Current packs/day: 0.00    Types: Cigarettes    Quit date: 05/10/2002    Years since quitting: 20.6   Smokeless tobacco: Never  Vaping Use   Vaping status: Never Used  Substance and Sexual Activity   Alcohol use: Never   Drug use: Never   Sexual activity: Not on file  Other Topics Concern   Not on file  Social History Narrative   Not on file   Social Determinants of Health   Financial Resource Strain: Not on file  Food Insecurity: Not on file  Transportation Needs: Not on file  Physical Activity: Not on file  Stress: Not on file  Social Connections: Unknown (08/07/2021)   Received from [facility]   Social Network    Social Network: Not on file  Intimate Partner Violence: Unknown  (06/29/2021)   Received from [facility]   HITS    Physically Hurt: Not on file    Insult or Talk Down To: Not on file    Threaten Physical Harm: Not on file    Scream or Curse: Not on file     Physical Exam   Vitals:   01/15/23 0200 01/15/23 0300  BP: (!) 169/69 (!) 154/73  Pulse: 62 (!) 58  Resp: 13 12  Temp:    SpO2: 91% 92%    CONSTITUTIONAL: Well-appearing, NAD NEURO/PSYCH:  Alert and oriented x 3, no focal deficits EYES:  eyes equal and reactive ENT/NECK:  no LAD, no JVD CARDIO: Regular rate, well-perfused, normal S1 and S2 PULM:  CTAB no wheezing or rhonchi GI/GU:  non-distended, non-tender MSK/SPINE:  No gross deformities, no edema SKIN:  no rash, atraumatic   *Additional and/or pertinent findings included in MDM below  Diagnostic and Interventional Summary    EKG Interpretation Date/Time:    Ventricular Rate:    PR Interval:    QRS Duration:    QT Interval:    QTC Calculation:   R Axis:      Text Interpretation:         Labs Reviewed  COMPREHENSIVE METABOLIC PANEL - Abnormal; Notable for the following components:      Result Value   Potassium 2.9 (*)    Glucose, Bld 128 (*)    Creatinine, Ser 1.26 (*)    Calcium 8.7 (*)    All other components within normal limits  CBC  LIPASE, BLOOD  PROTIME-INR  TYPE AND SCREEN    DG Chest Port 1 View  Final Result      Medications - No data to display   Procedures  /  Critical Care Procedures  ED Course and Medical Decision Making  Initial Impression and Ddx Some bright red blood in the stool over the past few days.  Not large volume.  Reassuring vital signs, completely benign abdominal exam, denies rectal pain.  Suspect more benign cause of the bleeding such as bleeding from a polyp or internal hemorrhoids.  Diverticular bleed considered as well.  Past medical/surgical history that increases complexity of ED encounter: None  Interpretation of Diagnostics I personally reviewed the laboratory  assessment and my interpretation is as follows: No significant blood count or electrolyte disturbance    Patient Reassessment and Ultimate Disposition/Management     With the reassuring blood counts and patient's well-appearing nature, normal vital signs, benign abdomen, there is little to no benefit for admission or further testing at this time.  He can keep a close eye on his symptoms and follow-up with primary care.  Will provide omeprazole to treat for any possible ulcer related bleeding, MiraLAX to try to avoid constipation which may be contributing to hemorrhoidal bleeding.  Patient management required discussion with the following services or consulting groups:  None  Complexity of Problems Addressed Acute illness or injury that poses threat of life of bodily function  Additional Data Reviewed and Analyzed Further history obtained from: Further history from spouse/family member  Additional Factors Impacting ED Encounter Risk Prescriptions  Elmer Sow. Pilar Plate, MD [facility] Emergency Medicine [facility] [email or web address]  Final Clinical Impressions(s) / ED Diagnoses     ICD-10-CM   1. Rectal bleeding  K62.5       ED Discharge Orders          Ordered    omeprazole (PRILOSEC) 20 MG capsule  Daily        01/15/23 0356    polyethylene glycol (MIRALAX) 17 g packet  Daily        01/15/23 0356             Discharge Instructions Discussed with and Provided to Patient:    Discharge Instructions      You were evaluated in the Emergency Department and after careful evaluation, we did not find any emergent condition requiring admission or further testing in the hospital.  Your exam/testing today is overall reassuring.  Take the omeprazole medication daily to decrease your stomach acid.  Use the MiraLAX 1 or 2 times daily to avoid firm stools.  Follow-up with your primary care doctor and/or gastroenterologist.  Please return to the Emergency  Department if you experience any worsening of your condition.   Thank you for allowing [place] to be a part of your care.      Sabas Sous, MD 01/15/23 0400

## 2023-02-06 ENCOUNTER — Other Ambulatory Visit (HOSPITAL_COMMUNITY): Payer: Self-pay | Admitting: Specialist

## 2023-02-06 DIAGNOSIS — R972 Elevated prostate specific antigen [PSA]: Secondary | ICD-10-CM

## 2023-02-12 ENCOUNTER — Ambulatory Visit (HOSPITAL_COMMUNITY)
Admission: RE | Admit: 2023-02-12 | Discharge: 2023-02-12 | Disposition: A | Discharge status: Home / Self Care | Payer: No Typology Code available for payment source | Source: Ambulatory Visit | Attending: Specialist | Admitting: Specialist

## 2023-02-12 DIAGNOSIS — R972 Elevated prostate specific antigen [PSA]: Secondary | ICD-10-CM | POA: Insufficient documentation

## 2023-02-12 MED ORDER — GADOBUTROL 1 MMOL/ML IV SOLN
10.0000 mL | Freq: Once | INTRAVENOUS | Status: AC | PRN
  Administered 2023-02-12: 10 mL via INTRAVENOUS

## 2023-03-08 ENCOUNTER — Encounter: Payer: Self-pay | Admitting: Gastroenterology

## 2023-03-08 ENCOUNTER — Ambulatory Visit (INDEPENDENT_AMBULATORY_CARE_PROVIDER_SITE_OTHER): Payer: No Typology Code available for payment source | Admitting: Gastroenterology

## 2023-03-08 VITALS — BP 135/60 | HR 60 | Temp 97.6°F | Ht 71.0 in | Wt 237.6 lb

## 2023-03-08 DIAGNOSIS — D509 Iron deficiency anemia, unspecified: Secondary | ICD-10-CM | POA: Diagnosis not present

## 2023-03-08 DIAGNOSIS — D500 Iron deficiency anemia secondary to blood loss (chronic): Secondary | ICD-10-CM

## 2023-03-08 DIAGNOSIS — Z860101 Personal history of adenomatous and serrated colon polyps: Secondary | ICD-10-CM | POA: Diagnosis not present

## 2023-03-08 DIAGNOSIS — R195 Other fecal abnormalities: Secondary | ICD-10-CM | POA: Diagnosis not present

## 2023-03-08 NOTE — Progress Notes (Signed)
GI Office Note    Referring Provider: Kathrin Penner, PA-C Primary Care Physician:  Hilarie Fredrickson  Primary Gastroenterologist: Hennie Duos. Marletta Lor, DO   Chief Complaint   Chief Complaint  Patient presents with   New Patient (Initial Visit)    Pt referred for anemia, pos fit test   History of Present Illness   Jose Scott is a 72 y.o. male presenting today request of the [facility] for anemia and Hemoccult positive stool.  Several year history of IDA, history of colon polyps, history of remote duodenal ulcer.  Small adenoma removed from the colon in 2023.  Patient seen by GI at the [facility] February 26, 2023.  Several year history of mild iron deficiency anemia.  Off and on oral iron for several years.  Patient denies any history of IV iron or blood transfusions.  Most recent colonoscopy April 2023 with 1 small adenoma removed and recommended 7-year repeat exam.  Patient reported very remote history of duodenal ulcer.  He has had some brbpr for several days back in 12/2022 prompting ED visit at [facility]. States passed what looked like a lot of blood. Hgb 13 at that time. No further rectal bleeding.  [facility] recommending [facility] and colonoscopy but due to backlog was referred for community evaluation.  Colonoscopy April 2023: 8 mm ascending colon polyp removed, tubular adenoma.  Recommend colonoscopy in 7 years depending on health.  Colonoscopy February 2018: 4 sessile polyps, measuring 3 to 10 mm in size, found at the rectosigmoid junction status post removal, tubular adenomas and 1 hyperplastic polyp.  Labs from August 2024: ALT 12, AST 11, total bilirubin 0.6, A1c 6.4, platelets 263,000, creatinine 1.2, albumin 4.1, hematocrit 40.5 slightly low,  Today: doing well at this time. No further rectal bleeding since in ED 12/2022. BMs daily. No melena. No heartburn, dysphagia, n/v. On omeprazole since in th ED 12/2022. States he was told they needed to cover for possible ulcers.   Patient states he was concerned that the medication provided to him for shingles may have irritated his stomach.  He believes he was on gabapentin.  He takes prednisone intermittently for gout.  Denies any significant NSAID use otherwise.        Medications   Current Outpatient Medications  Medication Sig Dispense Refill   acetaminophen (TYLENOL) 500 MG tablet Take 500 mg by mouth every 6 (six) hours as needed for fever.     albuterol (PROVENTIL HFA;VENTOLIN HFA) 108 (90 Base) MCG/ACT inhaler Inhale 2 puffs into the lungs every 6 (six) hours as needed for wheezing or shortness of breath. 1 Inhaler 2   amLODipine-benazepril (LOTREL) 10-20 MG capsule Take 1 capsule by mouth daily.     finasteride (PROPECIA) 1 MG tablet Take 1 mg by mouth daily.     fluticasone (FLONASE) 50 MCG/ACT nasal spray      Omega-3 Fatty Acids (FISH OIL) 1000 MG CAPS Take 1 capsule by mouth daily.     omeprazole (PRILOSEC) 20 MG capsule Take 1 capsule (20 mg total) by mouth daily. 30 capsule 1   polyethylene glycol (MIRALAX) 17 g packet Take 17 g by mouth daily. 30 each 1   potassium chloride (KLOR-CON) 8 MEQ tablet Take 1 tablet by mouth daily.     tamsulosin (FLOMAX) 0.4 MG CAPS capsule Take 1 capsule by mouth daily.     tetrahydrozoline 0.05 % ophthalmic solution Place 2 drops into both eyes 2 (two) times daily as  needed.     potassium chloride SA (KLOR-CON) 20 MEQ tablet Take 1 tablet (20 mEq total) by mouth 2 (two) times daily for 7 days, THEN 1 tablet (20 mEq total) daily for 7 days. 21 tablet 0   No current facility-administered medications for this visit.    Allergies   Allergies as of 03/08/2023 - Review Complete 03/08/2023  Allergen Reaction Noted   Codeine  05/10/2018   Rocephin [ceftriaxone sodium in dextrose] Hives 05/12/2018   Zithromax [azithromycin] Hives 05/12/2018    Past Medical History   Past Medical History:  Diagnosis Date   Enlarged prostate    Hyperlipidemia    Hypertension     IDA (iron deficiency anemia)    Obesity    Pneumonia    Prediabetes    Vitamin D deficiency     Past Surgical History   Past Surgical History:  Procedure Laterality Date   CATARACT EXTRACTION     HEMORRHOID SURGERY     PROSTATE BIOPSY      Past Family History   Family History  Problem Relation Age of Onset   Hypertension Other    Colon cancer Neg Hx     Past Social History   Social History   Socioeconomic History   Marital status: Married    Spouse name: Not on file   Number of children: Not on file   Years of education: Not on file   Highest education level: Not on file  Occupational History   Not on file  Tobacco Use   Smoking status: Former    Current packs/day: 0.00    Types: Cigarettes    Quit date: 05/10/2002    Years since quitting: 20.8   Smokeless tobacco: Never  Vaping Use   Vaping status: Never Used  Substance and Sexual Activity   Alcohol use: Never   Drug use: Never   Sexual activity: Not on file  Other Topics Concern   Not on file  Social History Narrative   Not on file   Social Drivers of Health   Financial Resource Strain: Not on file  Food Insecurity: Not on file  Transportation Needs: Not on file  Physical Activity: Not on file  Stress: Not on file  Social Connections: Unknown (08/07/2021)   Received from [facility]   Social Network    Social Network: Not on file  Intimate Partner Violence: Unknown (06/29/2021)   Received from [facility]   HITS    Physically Hurt: Not on file    Insult or Talk Down To: Not on file    Threaten Physical Harm: Not on file    Scream or Curse: Not on file    Review of Systems   General: Negative for anorexia, weight loss, fever, chills, fatigue, weakness. Eyes: Negative for vision changes.  ENT: Negative for hoarseness, difficulty swallowing , nasal congestion. CV: Negative for chest pain, angina, palpitations, dyspnea on exertion, peripheral edema.  Respiratory: Negative for dyspnea at  rest, dyspnea on exertion, cough, sputum, wheezing.  GI: See history of present illness. GU:  Negative for dysuria, hematuria, urinary incontinence, urinary frequency, nocturnal urination.  MS: Negative for joint pain, low back pain.  Derm: Negative for rash or itching.  Neuro: Negative for weakness, abnormal sensation, seizure, frequent headaches, memory loss,  confusion.  Psych: Negative for anxiety, depression, suicidal ideation, hallucinations.  Endo: Negative for unusual weight change.  Heme: Negative for bruising or bleeding. Allergy: Negative for rash or hives.  Physical Exam  BP 135/60   Pulse 60   Temp 97.6 F (36.4 C)   Ht 5\' 11"  (1.803 m)   Wt 237 lb 9.6 oz (107.8 kg)   BMI 33.14 kg/m    General: Well-nourished, well-developed in no acute distress.  Head: Normocephalic, atraumatic.   Eyes: Conjunctiva pink, no icterus. Mouth: Oropharyngeal mucosa moist and pink  Neck: Supple without thyromegaly, masses, or lymphadenopathy.  Lungs: Clear to auscultation bilaterally.  Heart: Regular rate and rhythm, no murmurs rubs or gallops.  Abdomen: Bowel sounds are normal, nontender, nondistended, no hepatosplenomegaly or masses,  no abdominal bruits or hernia, no rebound or guarding.   Rectal: deferred Extremities: No lower extremity edema. No clubbing or deformities.  Neuro: Alert and oriented x 4 , grossly normal neurologically.  Skin: Warm and dry, no rash or jaundice.   Psych: Alert and cooperative, normal mood and affect.  Labs     Lab Results  Component Value Date   WBC 5.6 01/15/2023   HGB 13.3 01/15/2023   HCT 40.8 01/15/2023   MCV 89.3 01/15/2023   PLT 209 01/15/2023   Lab Results  Component Value Date   ALT 13 01/15/2023   AST 16 01/15/2023   ALKPHOS 61 01/15/2023   BILITOT 0.4 01/15/2023   Lab Results  Component Value Date   NA 138 01/15/2023   CL 102 01/15/2023   K 2.9 (L) 01/15/2023   CO2 27 01/15/2023   BUN 13 01/15/2023   CREATININE 1.26  (H) 01/15/2023   GFRNONAA >60 01/15/2023   CALCIUM 8.7 (L) 01/15/2023   ALBUMIN 3.7 01/15/2023   GLUCOSE 128 (H) 01/15/2023    Imaging Studies   MR PROSTATE W WO CONTRAST Result Date: 02/14/2023 CLINICAL DATA:  Chronic elevated PSA.  R97.20 EXAM: MR PROSTATE WITHOUT AND WITH CONTRAST TECHNIQUE: Multiplanar multisequence MRI images were obtained of the pelvis centered about the prostate. Pre and post contrast images were obtained. CONTRAST:  10mL GADAVIST GADOBUTROL 1 MMOL/ML IV SOLN COMPARISON:  None Available. FINDINGS: Prostate: A 0.6 by 0.3 cm focus of low T2 signal in the posterior margin of the transition zone on image 11 of series 5 appears to have accentuated T1 signal and accordingly is likely from post biopsy blood products. Region of interest # 1: PI-RADS category 4 lesion of the right anterior transition zone in the base and mid gland with involvement of the right posterior transition zone in the base, with mostly ill-defined low T2 signal (image 27 of series 9) corresponding to reduced ADC map activity and restricted diffusion (image 8 of series 7 and 8). This measures 0.68 cc (1.3 by 0.7 by 1.0 cm). Volume: 3D volumetric assessment: Prostate volume 21.7 cc (4.6 by 2.8 by 3.4 cm). Transcapsular spread: Absent Seminal vesicle involvement: Absent Neurovascular bundle involvement: Absent Pelvic adenopathy: Absent Bone metastasis: Absent Other findings: Incidental tiny bone island in the right pubic bone. IMPRESSION: 1. PI-RADS category 4 lesion of the right anterior transition zone in the base and mid gland with involvement of the right posterior transition zone in the base. This lesion has substantially restricted diffusion. Electronically Signed   By: Gaylyn Rong M.D.   On: 02/14/2023 09:04    Assessment/Plan:   IDA/heme positive stool/rectal bleeding: -nearly two years since last colonoscopy, with history of adenomatous colon polyps -remote duodenal ulcer per patient, details  unavailable -recent rectal bleeding prompting ed visit. Subsequent stools checked, heme positive. [facility]. ASA 2.  I have discussed  the risks, alternatives, benefits with regards to but not limited to the risk of reaction to medication, bleeding, infection, perforation and the patient is agreeable to proceed. Written consent to be obtained. -due to intermittent hypokalemia, will get bmet prior to procedure.     Leanna Battles. Melvyn Neth, MHS, PA-C [facility]

## 2023-03-08 NOTE — Patient Instructions (Signed)
Colonoscopy and upper endoscopy to be scheduled.

## 2023-03-11 ENCOUNTER — Telehealth: Payer: Self-pay | Admitting: *Deleted

## 2023-03-11 DIAGNOSIS — D500 Iron deficiency anemia secondary to blood loss (chronic): Secondary | ICD-10-CM

## 2023-03-11 DIAGNOSIS — Z860101 Personal history of adenomatous and serrated colon polyps: Secondary | ICD-10-CM

## 2023-03-11 DIAGNOSIS — R195 Other fecal abnormalities: Secondary | ICD-10-CM

## 2023-03-11 MED ORDER — PEG 3350-KCL-NA BICARB-NACL 420 G PO SOLR: 4000.0000 mL | Freq: Once | ORAL | 0 refills | Status: AC

## 2023-03-11 NOTE — Telephone Encounter (Signed)
Called pt. He has been scheduled for TCS/EGD with Dr. Marletta Lor ASA 2 1/14. Aware will mail instructions. Rx for prep to [facility] pharmacy.

## 2023-04-03 LAB — BASIC METABOLIC PANEL
BUN/Creatinine Ratio: 9 — ABNORMAL LOW (ref 10–24)
BUN: 12 mg/dL (ref 8–27)
CO2: 27 mmol/L (ref 20–29)
Calcium: 9.5 mg/dL (ref 8.6–10.2)
Chloride: 102 mmol/L (ref 96–106)
Creatinine, Ser: 1.37 mg/dL — ABNORMAL HIGH (ref 0.76–1.27)
Glucose: 103 mg/dL — ABNORMAL HIGH (ref 70–99)
Potassium: 3.6 mmol/L (ref 3.5–5.2)
Sodium: 143 mmol/L (ref 134–144)
eGFR: 55 mL/min/{1.73_m2} — ABNORMAL LOW (ref >59)

## 2023-04-09 ENCOUNTER — Encounter (HOSPITAL_COMMUNITY)
Admission: RE | Disposition: A | Discharge status: Home / Self Care | Payer: Self-pay | Source: Home / Self Care | Attending: Internal Medicine

## 2023-04-09 ENCOUNTER — Ambulatory Visit (HOSPITAL_COMMUNITY)
Admission: RE | Admit: 2023-04-09 | Discharge: 2023-04-09 | Disposition: A | Discharge status: Home / Self Care | Payer: No Typology Code available for payment source | Attending: Internal Medicine | Admitting: Internal Medicine

## 2023-04-09 ENCOUNTER — Ambulatory Visit (HOSPITAL_BASED_OUTPATIENT_CLINIC_OR_DEPARTMENT_OTHER): Payer: No Typology Code available for payment source | Admitting: Certified Registered Nurse Anesthetist

## 2023-04-09 ENCOUNTER — Ambulatory Visit (HOSPITAL_COMMUNITY): Payer: No Typology Code available for payment source | Admitting: Certified Registered Nurse Anesthetist

## 2023-04-09 ENCOUNTER — Other Ambulatory Visit: Payer: Self-pay

## 2023-04-09 ENCOUNTER — Encounter (HOSPITAL_COMMUNITY): Payer: Self-pay | Admitting: Internal Medicine

## 2023-04-09 DIAGNOSIS — Z87891 Personal history of nicotine dependence: Secondary | ICD-10-CM | POA: Insufficient documentation

## 2023-04-09 DIAGNOSIS — K648 Other hemorrhoids: Secondary | ICD-10-CM | POA: Insufficient documentation

## 2023-04-09 DIAGNOSIS — R7303 Prediabetes: Secondary | ICD-10-CM | POA: Diagnosis not present

## 2023-04-09 DIAGNOSIS — K2980 Duodenitis without bleeding: Secondary | ICD-10-CM | POA: Diagnosis not present

## 2023-04-09 DIAGNOSIS — D124 Benign neoplasm of descending colon: Secondary | ICD-10-CM | POA: Insufficient documentation

## 2023-04-09 DIAGNOSIS — D509 Iron deficiency anemia, unspecified: Secondary | ICD-10-CM | POA: Diagnosis not present

## 2023-04-09 DIAGNOSIS — K635 Polyp of colon: Secondary | ICD-10-CM | POA: Diagnosis not present

## 2023-04-09 DIAGNOSIS — K5140 Inflammatory polyps of colon without complications: Secondary | ICD-10-CM | POA: Diagnosis not present

## 2023-04-09 DIAGNOSIS — K3189 Other diseases of stomach and duodenum: Secondary | ICD-10-CM | POA: Diagnosis not present

## 2023-04-09 DIAGNOSIS — K449 Diaphragmatic hernia without obstruction or gangrene: Secondary | ICD-10-CM

## 2023-04-09 DIAGNOSIS — D125 Benign neoplasm of sigmoid colon: Secondary | ICD-10-CM | POA: Insufficient documentation

## 2023-04-09 DIAGNOSIS — I10 Essential (primary) hypertension: Secondary | ICD-10-CM | POA: Diagnosis not present

## 2023-04-09 DIAGNOSIS — K921 Melena: Secondary | ICD-10-CM | POA: Insufficient documentation

## 2023-04-09 DIAGNOSIS — K51411 Inflammatory polyps of colon with rectal bleeding: Secondary | ICD-10-CM | POA: Diagnosis not present

## 2023-04-09 DIAGNOSIS — K2970 Gastritis, unspecified, without bleeding: Secondary | ICD-10-CM | POA: Diagnosis not present

## 2023-04-09 HISTORY — PX: POLYPECTOMY: SHX5525

## 2023-04-09 HISTORY — PX: ESOPHAGOGASTRODUODENOSCOPY (EGD) WITH PROPOFOL: SHX5813

## 2023-04-09 HISTORY — PX: COLONOSCOPY WITH PROPOFOL: SHX5780

## 2023-04-09 HISTORY — PX: BIOPSY: SHX5522

## 2023-04-09 SURGERY — COLONOSCOPY WITH PROPOFOL
Anesthesia: General

## 2023-04-09 MED ORDER — SODIUM CHLORIDE 0.9% FLUSH: 3.0000 mL | INTRAVENOUS | Status: DC | PRN

## 2023-04-09 MED ORDER — PROPOFOL 500 MG/50ML IV EMUL
INTRAVENOUS | Status: DC | PRN
  Administered 2023-04-09: 100 ug/kg/min via INTRAVENOUS

## 2023-04-09 MED ORDER — LACTATED RINGERS IV SOLN: INTRAVENOUS | Status: DC | PRN

## 2023-04-09 MED ORDER — PROPOFOL 10 MG/ML IV BOLUS
INTRAVENOUS | Status: DC | PRN
  Administered 2023-04-09: 20 mg via INTRAVENOUS
  Administered 2023-04-09: 30 mg via INTRAVENOUS
  Administered 2023-04-09: 120 mg via INTRAVENOUS

## 2023-04-09 MED ORDER — OMEPRAZOLE 20 MG PO CPDR: 20.0000 mg | DELAYED_RELEASE_CAPSULE | Freq: Two times a day (BID) | ORAL | 11 refills | Status: AC

## 2023-04-09 MED ORDER — SODIUM CHLORIDE 0.9% FLUSH: 3.0000 mL | Freq: Two times a day (BID) | INTRAVENOUS | Status: DC

## 2023-04-09 MED ORDER — LIDOCAINE HCL (CARDIAC) PF 100 MG/5ML IV SOSY
PREFILLED_SYRINGE | INTRAVENOUS | Status: DC | PRN
  Administered 2023-04-09: 80 mg via INTRATRACHEAL

## 2023-04-09 NOTE — Op Note (Signed)
 [facility] Patient Name: Jose Scott Procedure Date: 04/09/2023 9:41 AM MRN: [id number] Date of Birth: July 06, 1950 Attending MD: Carlin POUR. [name], [id number] CSN: [id number] Age: 73 Admit Type: Outpatient Procedure:                Upper GI endoscopy Indications:              Iron deficiency anemia, Heme positive stool Providers:                Carlin POUR. Cindie, DO, Leandrew Edelman RN, RN, Nidia Oak Referring MD:              Medicines:                See the Anesthesia note for documentation of the                            administered medications Complications:            No immediate complications. Estimated Blood Loss:     Estimated blood loss was minimal. Procedure:                Pre-Anesthesia Assessment:                           - The anesthesia plan was to use monitored                            anesthesia care (MAC).                           After obtaining informed consent, the endoscope was                            passed under direct vision. Throughout the                            procedure, the patient's blood pressure, pulse, and                            oxygen  saturations were monitored continuously. The                            [id number]-H190 ([id number]) scope was introduced through the                            mouth, and advanced to the second part of duodenum.                            The upper GI endoscopy was accomplished without                            difficulty. The patient tolerated the procedure                            well.  Scope In: 10:03:02 AM Scope Out: 10:06:52 AM Total Procedure Duration: 0 hours 3 minutes 50 seconds  Findings:      A small hiatal hernia was present.      Patchy mild inflammation characterized by erosions and erythema was       found in the gastric body and in the gastric antrum. Biopsies were taken       with a cold forceps for Helicobacter pylori testing.      A single  medium submucosal nodule ?lipoma was found in the second       portion of the duodenum. Biopsies were taken with a cold forceps for       histology. Impression:               - Small hiatal hernia.                           - Gastritis. Biopsied.                           - Submucosal nodule found in the duodenum. Biopsied. Moderate Sedation:      Per Anesthesia Care Recommendation:           - Patient has a contact number available for                            emergencies. The signs and symptoms of potential                            delayed complications were discussed with the                            patient. Return to normal activities tomorrow.                            Written discharge instructions were provided to the                            patient.                           - Resume previous diet.                           - Continue present medications.                           - Await pathology results.                           - Use a proton pump inhibitor PO BID for 12 weeks.                           - No ibuprofen, naproxen, or other non-steroidal                            anti-inflammatory drugs. Procedure Code(s):        --- Professional ---  56760, Esophagogastroduodenoscopy, flexible,                            transoral; with biopsy, single or multiple Diagnosis Code(s):        --- Professional ---                           K44.9, Diaphragmatic hernia without obstruction or                            gangrene                           K29.70, Gastritis, unspecified, without bleeding                           K31.89, Other diseases of stomach and duodenum                           D50.9, Iron deficiency anemia, unspecified                           R19.5, Other fecal abnormalities CPT copyright 2022 American Medical Association. All rights reserved. The codes documented in this report are preliminary and upon coder review may   be revised to meet current compliance requirements. Carlin POUR. Cindie, DO Carlin POUR. Cindie, DO 04/09/2023 10:10:56 AM This report has been signed electronically. Number of Addenda: 0

## 2023-04-09 NOTE — Transfer of Care (Signed)
 Immediate Anesthesia Transfer of Care Note  Patient: Jose Scott  Procedure(s) Performed: COLONOSCOPY WITH PROPOFOL  ESOPHAGOGASTRODUODENOSCOPY (EGD) WITH PROPOFOL  BIOPSY POLYPECTOMY  Patient Location: Endoscopy Unit  Anesthesia Type:General  Level of Consciousness: awake and patient cooperative  Airway & Oxygen  Therapy: Patient Spontanous Breathing  Post-op Assessment: Report given to RN and Post -op Vital signs reviewed and stable  Post vital signs: Reviewed and stable  Last Vitals:  Vitals Value Taken Time  BP 119/55 04/09/23 1033  Temp 36.6 C 04/09/23 1033  Pulse 69 04/09/23 1033  Resp 69 04/09/23 1033  SpO2 97 % 04/09/23 1033    Last Pain:  Vitals:   04/09/23 1033  TempSrc: Oral  PainSc: 0-No pain      Patients Stated Pain Goal: 4 (04/09/23 0845)  Complications: No notable events documented.

## 2023-04-09 NOTE — Op Note (Addendum)
 [facility] Patient Name: Jose Scott Procedure Date: 04/09/2023 9:41 AM MRN: [id number] Date of Birth: 1950-10-29 Attending MD: Carlin POUR. [name], [id number] CSN: [id number] Age: 73 Admit Type: Outpatient Procedure:                Colonoscopy Indications:              Heme positive stool, Iron deficiency anemia Providers:                Carlin POUR. Cindie, DO, Leandrew Edelman RN, RN, Nidia Oak Referring MD:              Medicines:                See the Anesthesia note for documentation of the                            administered medications Complications:            No immediate complications. Estimated Blood Loss:     Estimated blood loss was minimal. Procedure:                Pre-Anesthesia Assessment:                           - The anesthesia plan was to use monitored                            anesthesia care (MAC).                           - The anesthesia plan was to use monitored                            anesthesia care (MAC).                           After obtaining informed consent, the colonoscope                            was passed under direct vision. Throughout the                            procedure, the patient's blood pressure, pulse, and                            oxygen  saturations were monitored continuously. The                            [id number]-HQ190L ([id number]) scope was introduced through                            the anus and advanced to the the cecum, identified                            by appendiceal orifice and ileocecal valve. The  colonoscopy was performed without difficulty. The                            patient tolerated the procedure well. The quality                            of the bowel preparation was evaluated using the                            BBPS [facility] Bowel Preparation Scale) with scores                            of: Right Colon = 3, Transverse Colon = 3 and  Left                            Colon = 3 (entire mucosa seen well with no residual                            staining, small fragments of stool or opaque                            liquid). The total BBPS score equals 9. Scope In: 10:12:12 AM Scope Out: 10:28:12 AM Scope Withdrawal Time: 0 hours 13 minutes 19 seconds  Total Procedure Duration: 0 hours 16 minutes 0 seconds  Findings:      Non-bleeding internal hemorrhoids were found during retroflexion. The       hemorrhoids were moderate.      A 4 mm polyp was found in the cecum. The polyp was sessile. The polyp       was removed with a cold snare. Resection and retrieval were complete.      Three sessile polyps were found in the sigmoid colon and descending       colon. The polyps were 4 to 6 mm in size. These polyps were removed with       a cold snare. Resection and retrieval were complete.      The exam was otherwise without abnormality. Impression:               - Non-bleeding internal hemorrhoids.                           - One 4 mm polyp in the cecum, removed with a cold                            snare. Resected and retrieved.                           - Three 4 to 6 mm polyps in the sigmoid colon and                            in the descending colon, removed with a cold snare.                            Resected and retrieved.                           -  The examination was otherwise normal. Moderate Sedation:      Per Anesthesia Care Recommendation:           - Patient has a contact number available for                            emergencies. The signs and symptoms of potential                            delayed complications were discussed with the                            patient. Return to normal activities tomorrow.                            Written discharge instructions were provided to the                            patient.                           - Resume previous diet.                           - Continue  present medications.                           - Await pathology results.                           - Repeat colonoscopy in 5-7 years for surveillance.                           - Return to GI clinic in 3 months.                           - Consider hemorrhoid banding if bleeding continues Procedure Code(s):        --- Professional ---                           [phone], Colonoscopy, flexible; with removal of                            tumor(s), polyp(s), or other lesion(s) by snare                            technique Diagnosis Code(s):        --- Professional ---                           D12.0, Benign neoplasm of cecum                           D12.5, Benign neoplasm of sigmoid colon                           D12.4, Benign neoplasm of descending  colon                           K64.8, Other hemorrhoids                           R19.5, Other fecal abnormalities                           D50.9, Iron deficiency anemia, unspecified CPT copyright 2022 American Medical Association. All rights reserved. The codes documented in this report are preliminary and upon coder review may  be revised to meet current compliance requirements. Carlin POUR. Cindie, DO Carlin POUR. Cindie, DO 04/09/2023 10:33:07 AM This report has been signed electronically. Number of Addenda: 0

## 2023-04-09 NOTE — Discharge Instructions (Addendum)
 EGD Discharge instructions Please read the instructions outlined below and refer to this sheet in the next few weeks. These discharge instructions provide you with general information on caring for yourself after you leave the hospital. Your doctor may also give you specific instructions. While your treatment has been planned according to the most current medical practices available, unavoidable complications occasionally occur. If you have any problems or questions after discharge, please call your doctor. ACTIVITY You may resume your regular activity but move at a slower pace for the next 24 hours.  Take frequent rest periods for the next 24 hours.  Walking will help expel (get rid of) the air and reduce the bloated feeling in your abdomen.  No driving for 24 hours (because of the anesthesia (medicine) used during the test).  You may shower.  Do not sign any important legal documents or operate any machinery for 24 hours (because of the anesthesia used during the test).  NUTRITION Drink plenty of fluids.  You may resume your normal diet.  Begin with a light meal and progress to your normal diet.  Avoid alcoholic beverages for 24 hours or as instructed by your caregiver.  MEDICATIONS You may resume your normal medications unless your caregiver tells you otherwise.  WHAT YOU CAN EXPECT TODAY You may experience abdominal discomfort such as a feeling of fullness or "gas" pains.  FOLLOW-UP Your doctor will discuss the results of your test with you.  SEEK IMMEDIATE MEDICAL ATTENTION IF ANY OF THE FOLLOWING OCCUR: Excessive nausea (feeling sick to your stomach) and/or vomiting.  Severe abdominal pain and distention (swelling).  Trouble swallowing.  Temperature over 101 F (37.8 C).  Rectal bleeding or vomiting of blood.      Colonoscopy Discharge Instructions  Read the instructions outlined below and refer to this sheet in the next few weeks. These discharge instructions provide you  with general information on caring for yourself after you leave the hospital. Your doctor may also give you specific instructions. While your treatment has been planned according to the most current medical practices available, unavoidable complications occasionally occur.   ACTIVITY You may resume your regular activity, but move at a slower pace for the next 24 hours.  Take frequent rest periods for the next 24 hours.  Walking will help get rid of the air and reduce the bloated feeling in your belly (abdomen).  No driving for 24 hours (because of the medicine (anesthesia) used during the test).   Do not sign any important legal documents or operate any machinery for 24 hours (because of the anesthesia used during the test).  NUTRITION Drink plenty of fluids.  You may resume your normal diet as instructed by your doctor.  Begin with a light meal and progress to your normal diet. Heavy or fried foods are harder to digest and may make you feel sick to your stomach (nauseated).  Avoid alcoholic beverages for 24 hours or as instructed.  MEDICATIONS You may resume your normal medications unless your doctor tells you otherwise.  WHAT YOU CAN EXPECT TODAY Some feelings of bloating in the abdomen.  Passage of more gas than usual.  Spotting of blood in your stool or on the toilet paper.  IF YOU HAD POLYPS REMOVED DURING THE COLONOSCOPY: No aspirin products for 7 days or as instructed.  No alcohol for 7 days or as instructed.  Eat a soft diet for the next 24 hours.  FINDING OUT THE RESULTS OF YOUR TEST Not all test results  are available during your visit. If your test results are not back during the visit, make an appointment with your caregiver to find out the results. Do not assume everything is normal if you have not heard from your caregiver or the medical facility. It is important for you to follow up on all of your test results.  SEEK IMMEDIATE MEDICAL ATTENTION IF: You have more than a  spotting of blood in your stool.  Your belly is swollen (abdominal distention).  You are nauseated or vomiting.  You have a temperature over 101.  You have abdominal pain or discomfort that is severe or gets worse throughout the day.   Your EGD revealed mild amount inflammation in your stomach.  I took biopsies of this to rule out infection with a bacteria called H. pylori.  You also have a medium size nodule in your small bowel, this is likely benign lipoma, took samples of it as well.  Esophagus appeared normal.  Your colonoscopy revealed 4 polyp(s) which I removed successfully. Await pathology results, my office will contact you. I recommend repeating colonoscopy in 5-7 years for surveillance purposes depending on pathology results.   You also had moderate size internal hemorrhoids which is likely the cause of your bleeding.  We can consider treatment for these in the office with banding.  Follow-up in GI office in 2 to 3 months.  Office will notify you.   I hope you have a great rest of your week!  Carlin POUR. Cindie, D.O. Gastroenterology and Hepatology [facility]

## 2023-04-09 NOTE — Anesthesia Procedure Notes (Signed)
 Date/Time: 04/09/2023 9:52 AM  Performed by: Barbarann Verneita RAMAN, CRNAPre-anesthesia Checklist: Patient identified, Emergency Drugs available, Suction available, Timeout performed and Patient being monitored Patient Re-evaluated:Patient Re-evaluated prior to induction Oxygen  Delivery Method: Nasal Cannula

## 2023-04-09 NOTE — Anesthesia Procedure Notes (Signed)
 Date/Time: 04/09/2023 9:57 AM  Performed by: Barbarann Verneita RAMAN, CRNAPre-anesthesia Checklist: Patient identified, Emergency Drugs available, Suction available, Timeout performed and Patient being monitored Patient Re-evaluated:Patient Re-evaluated prior to induction Oxygen  Delivery Method: Non-rebreather mask

## 2023-04-09 NOTE — H&P (Signed)
 Primary Care Physician:  Silva Bernardino LABOR, PA-C Primary Gastroenterologist:  Dr. Cindie  Pre-Procedure History & Physical: HPI:  Jose Scott is a 73 y.o. male is here for an EGD and colonoscopy to be performed for iron deficiency anemia, heme positive stool, rectal bleeding.   Past Medical History:  Diagnosis Date   Enlarged prostate    Hyperlipidemia    Hypertension    IDA (iron deficiency anemia)    Obesity    Pneumonia    Prediabetes    Vitamin D deficiency     Past Surgical History:  Procedure Laterality Date   CATARACT EXTRACTION     HEMORRHOID SURGERY     PROSTATE BIOPSY      Prior to Admission medications   Medication Sig Start Date End Date Taking? Authorizing Provider  acetaminophen  (TYLENOL ) 500 MG tablet Take 500 mg by mouth every 6 (six) hours as needed for fever.   Yes [provider]  albuterol  (PROVENTIL  HFA;VENTOLIN  HFA) 108 (90 Base) MCG/ACT inhaler Inhale 2 puffs into the lungs every 6 (six) hours as needed for wheezing or shortness of breath. 05/13/18  Yes Antoinette Doe, MD  amLODipine -benazepril (LOTREL) 10-20 MG capsule Take 1 capsule by mouth daily. 03/01/18  Yes [provider]  chlorthalidone  (HYGROTON ) 25 MG tablet Take 25 mg by mouth daily.   Yes [provider]  finasteride (PROPECIA) 1 MG tablet Take 1 mg by mouth daily.   Yes [provider]  fluticasone OREN) 50 MCG/ACT nasal spray  01/08/19  Yes [provider]  Omega-3 Fatty Acids (FISH OIL) 1000 MG CAPS Take 1 capsule by mouth daily.   Yes [provider]  omeprazole  (PRILOSEC) 20 MG capsule Take 1 capsule (20 mg total) by mouth daily. 01/15/23  Yes Theadore Ozell HERO, MD  polyethylene glycol (MIRALAX ) 17 g packet Take 17 g by mouth daily. 01/15/23  Yes Theadore Ozell HERO, MD  potassium chloride  (KLOR-CON ) 8 MEQ tablet Take 1 tablet by mouth daily. 12/10/17  Yes [provider]  tamsulosin  (FLOMAX ) 0.4 MG CAPS capsule Take 1 capsule  by mouth daily. 01/02/18  Yes [provider]  tetrahydrozoline 0.05 % ophthalmic solution Place 2 drops into both eyes 2 (two) times daily as needed.   Yes [provider]  potassium chloride  SA (KLOR-CON ) 20 MEQ tablet Take 1 tablet (20 mEq total) by mouth 2 (two) times daily for 7 days, THEN 1 tablet (20 mEq total) daily for 7 days. 01/09/19 01/23/19  Neldon Hamp RAMAN, PA    Allergies as of 03/11/2023 - Review Complete 03/08/2023  Allergen Reaction Noted   Codeine  05/10/2018   Rocephin  [ceftriaxone  sodium in dextrose] Hives 05/12/2018   Zithromax  [azithromycin ] Hives 05/12/2018    Family History  Problem Relation Age of Onset   Hypertension Other    Colon cancer Neg Hx     Social History   Socioeconomic History   Marital status: Married    Spouse name: Not on file   Number of children: Not on file   Years of education: Not on file   Highest education level: Not on file  Occupational History   Not on file  Tobacco Use   Smoking status: Former    Current packs/day: 0.00    Types: Cigarettes    Quit date: 05/10/2002    Years since quitting: 20.9   Smokeless tobacco: Never  Vaping Use   Vaping status: Never Used  Substance and Sexual Activity   Alcohol use: Never  Drug use: Never   Sexual activity: Not on file  Other Topics Concern   Not on file  Social History Narrative   Not on file   Social Drivers of Health   Financial Resource Strain: Not on file  Food Insecurity: Not on file  Transportation Needs: Not on file  Physical Activity: Not on file  Stress: Not on file  Social Connections: Unknown (08/07/2021)   Received from [facility]   Social Network    Social Network: Not on file  Intimate Partner Violence: Unknown (06/29/2021)   Received from [facility]   HITS    Physically Hurt: Not on file    Insult or Talk Down To: Not on file    Threaten Physical Harm: Not on file    Scream or Curse: Not on file    Review of  Systems: General: Negative for fever, chills, fatigue, weakness. Eyes: Negative for vision changes.  ENT: Negative for hoarseness, difficulty swallowing , nasal congestion. CV: Negative for chest pain, angina, palpitations, dyspnea on exertion, peripheral edema.  Respiratory: Negative for dyspnea at rest, dyspnea on exertion, cough, sputum, wheezing.  GI: See history of present illness. GU:  Negative for dysuria, hematuria, urinary incontinence, urinary frequency, nocturnal urination.  MS: Negative for joint pain, low back pain.  Derm: Negative for rash or itching.  Neuro: Negative for weakness, abnormal sensation, seizure, frequent headaches, memory loss, confusion.  Psych: Negative for anxiety, depression Endo: Negative for unusual weight change.  Heme: Negative for bruising or bleeding. Allergy: Negative for rash or hives.  Physical Exam: Vital signs in last 24 hours: Temp:  [98.2 F (36.8 C)] 98.2 F (36.8 C) (01/14 0845) Pulse Rate:  [72] 72 (01/14 0845) Resp:  [12] 12 (01/14 0845) BP: (157)/(69) 157/69 (01/14 0845) SpO2:  [93 %] 93 % (01/14 0845) Weight:  [107.5 kg] 107.5 kg (01/14 0845)   General:   Alert,  Well-developed, well-nourished, pleasant and cooperative in NAD Head:  Normocephalic and atraumatic. Eyes:  Sclera clear, no icterus.   Conjunctiva pink. Ears:  Normal auditory acuity. Nose:  No deformity, discharge,  or lesions. Msk:  Symmetrical without gross deformities. Normal posture. Extremities:  Without clubbing or edema. Neurologic:  Alert and  oriented x4;  grossly normal neurologically. Skin:  Intact without significant lesions or rashes. Psych:  Alert and cooperative. Normal mood and affect.   Impression/Plan: Jose Scott is here for an EGD and colonoscopy to be performed for iron deficiency anemia, heme positive stool, rectal bleeding.   Risks, benefits, limitations, imponderables and alternatives regarding procedure have been reviewed with the  patient. Questions have been answered. All parties agreeable.

## 2023-04-09 NOTE — Anesthesia Postprocedure Evaluation (Signed)
 Anesthesia Post Note  Patient: CAYDE HELD  Procedure(s) Performed: COLONOSCOPY WITH PROPOFOL  ESOPHAGOGASTRODUODENOSCOPY (EGD) WITH PROPOFOL  BIOPSY POLYPECTOMY  Patient location during evaluation: PACU Anesthesia Type: General Level of consciousness: awake and alert Pain management: pain level controlled Vital Signs Assessment: post-procedure vital signs reviewed and stable Respiratory status: spontaneous breathing, nonlabored ventilation, respiratory function stable and patient connected to nasal cannula oxygen  Cardiovascular status: blood pressure returned to baseline and stable Postop Assessment: no apparent nausea or vomiting Anesthetic complications: no   There were no known notable events for this encounter.   Last Vitals:  Vitals:   04/09/23 0845 04/09/23 1033  BP: (!) 157/69 (!) 119/55  Pulse: 72 69  Resp: 12 (!) 69  Temp: 36.8 C 36.6 C  SpO2: 93% 97%    Last Pain:  Vitals:   04/09/23 1033  TempSrc: Oral  PainSc: 0-No pain                 Rayna Brenner L Haeley Fordham

## 2023-04-09 NOTE — Anesthesia Preprocedure Evaluation (Addendum)
 Anesthesia Evaluation  Patient identified by MRN, date of birth, ID band Patient awake    Reviewed: Allergy & Precautions, H&P , NPO status , Patient's Chart, lab work & pertinent test results, reviewed documented beta blocker date and time   Airway Mallampati: II  TM Distance: >3 FB Neck ROM: full    Dental  (+) Dental Advisory Given, Missing,  Most of teeth are missing:   Pulmonary former smoker   Pulmonary exam normal breath sounds clear to auscultation       Cardiovascular Exercise Tolerance: Good hypertension, Normal cardiovascular exam Rhythm:regular Rate:Normal     Neuro/Psych negative neurological ROS  negative psych ROS   GI/Hepatic negative GI ROS, Neg liver ROS,,,  Endo/Other  negative endocrine ROS  prediabetes  Renal/GU negative Renal ROS  negative genitourinary   Musculoskeletal   Abdominal   Peds  Hematology negative hematology ROS (+)   Anesthesia Other Findings   Reproductive/Obstetrics negative OB ROS                             Anesthesia Physical Anesthesia Plan  ASA: 2  Anesthesia Plan: General   Post-op Pain Management: Minimal or no pain anticipated   Induction: Intravenous  PONV Risk Score and Plan: Propofol  infusion  Airway Management Planned: Nasal Cannula and Natural Airway  Additional Equipment: None  Intra-op Plan:   Post-operative Plan:   Informed Consent: I have reviewed the patients History and Physical, chart, labs and discussed the procedure including the risks, benefits and alternatives for the proposed anesthesia with the patient or authorized representative who has indicated his/her understanding and acceptance.     Dental Advisory Given  Plan Discussed with: CRNA  Anesthesia Plan Comments:        Anesthesia Quick Evaluation

## 2023-04-10 LAB — SURGICAL PATHOLOGY

## 2023-04-11 ENCOUNTER — Encounter (HOSPITAL_COMMUNITY): Payer: Self-pay | Admitting: Internal Medicine

## 2023-06-26 ENCOUNTER — Other Ambulatory Visit (HOSPITAL_COMMUNITY): Payer: Self-pay | Admitting: Urology

## 2023-06-26 DIAGNOSIS — C61 Malignant neoplasm of prostate: Secondary | ICD-10-CM

## 2023-06-26 DIAGNOSIS — R972 Elevated prostate specific antigen [PSA]: Secondary | ICD-10-CM

## 2023-07-08 ENCOUNTER — Encounter (HOSPITAL_COMMUNITY): Admission: RE | Admit: 2023-07-08 | Source: Ambulatory Visit

## 2023-07-17 ENCOUNTER — Other Ambulatory Visit

## 2023-07-18 ENCOUNTER — Ambulatory Visit (HOSPITAL_COMMUNITY)
Admission: RE | Admit: 2023-07-18 | Discharge: 2023-07-18 | Disposition: A | Discharge status: Home / Self Care | Source: Ambulatory Visit | Attending: Urology | Admitting: Urology

## 2023-07-18 DIAGNOSIS — C61 Malignant neoplasm of prostate: Secondary | ICD-10-CM | POA: Diagnosis present

## 2023-07-18 DIAGNOSIS — R972 Elevated prostate specific antigen [PSA]: Secondary | ICD-10-CM | POA: Insufficient documentation

## 2023-07-18 MED ORDER — FLOTUFOLASTAT F 18 GALLIUM 296-5846 MBQ/ML IV SOLN
8.1560 | Freq: Once | INTRAVENOUS | Status: AC
  Administered 2023-07-18: 8.156 via INTRAVENOUS

## 2023-08-14 ENCOUNTER — Telehealth: Payer: Self-pay | Admitting: Radiation Oncology

## 2023-08-14 NOTE — Telephone Encounter (Signed)
 Emailed Cheryn Coss at [place] to ask for update on this pt's VA Authorization to be seen here with our radiation oncology team.

## 2023-08-25 NOTE — Progress Notes (Signed)
 Radiation Oncology         [phone] ________________________________  Initial Outpatient Consultation  Name: Jose Scott MRN: [id number]  Date: 08/26/2023  DOB: 1950/05/19  CC:Daiva Duane, PA-C  Lahoma Pigg, MD   REFERRING PHYSICIAN: Lahoma Pigg, MD  DIAGNOSIS: 73 y.o. gentleman with Stage T1c adenocarcinoma of the prostate with Gleason score of 4+3, and PSA of 21, adjusted for finasteride.    ICD-10-CM   1. Malignant neoplasm of prostate (HCC)  C61       HISTORY OF PRESENT ILLNESS: Jose Scott is a 73 y.o. male with a diagnosis of prostate cancer. He has been followed by the [facility] for a history of an elevated PSA dating back to at least 2017. He underwent two previous prostate biopsies in 2018 and 2019, both of which were negative. He was referred to Dr. Annitta Kindler at [facility] in 2022 following a prostate MRI that showed a small PI-RADS 4 lesion, as well as persistently elevated PSA of 15.5. He was started on finasteride at that time, and his PSA appropriately decreased to 7.53, so he continued on observation. His PSA remained stable through 2023 but increased to 9.27 (18.6 adjusted for finasteride) in 07/2022. This was further elevated at 10.5 (21; adjusted for finasteride) in 01/2023 prompting a repeat prostate MRI which was performed on 02/12/23 and showed a PI-RADS 4 lesion in the right anterior transition zone at the base and mid gland with involvement of the right posterior transition zone at the base. The patient was seen by Dr. Freddi Jaeger in 04/2023 and proceeded to MRI fusion biopsy of the prostate on 06/12/23.  The prostate volume measured 23 cc.  Out of 15 core biopsies, 3 were positive.  The maximum Gleason score was 4+3, and this was seen in all three samples from the MRI ROI (with perineural invasion). Of note, in the 12 standard biopsy cores, there was one sample with atypia, and all others were benign.  He underwent staging PSMA PET scan on 07/18/23 showing no evidence of  disease outside of the prostate.  The patient reviewed the biopsy and imaging results with his urologist at [facility] and at the [facility] and he has kindly been referred today for discussion of potential radiation treatment options.   PREVIOUS RADIATION THERAPY: No  PAST MEDICAL HISTORY:  Past Medical History:  Diagnosis Date   Elevated PSA    Enlarged prostate    Hyperlipidemia    Hypertension    IDA (iron deficiency anemia)    Obesity    Pneumonia    Prediabetes    Vitamin D deficiency       PAST SURGICAL HISTORY: Past Surgical History:  Procedure Laterality Date   BIOPSY  04/09/2023   Procedure: BIOPSY;  Surgeon: Vinetta Greening, DO;  [facility];  Service: Endoscopy;;   CATARACT EXTRACTION     COLONOSCOPY WITH PROPOFOL  N/A 04/09/2023   Procedure: COLONOSCOPY WITH PROPOFOL ;  Surgeon: Vinetta Greening, DO;  [facility];  Service: Endoscopy;  Laterality: N/A;  10:30AM, [facility] 2   ESOPHAGOGASTRODUODENOSCOPY (EGD) WITH PROPOFOL  N/A 04/09/2023   Procedure: ESOPHAGOGASTRODUODENOSCOPY (EGD) WITH PROPOFOL ;  Surgeon: Vinetta Greening, DO;  [facility];  Service: Endoscopy;  Laterality: N/A;   HEMORRHOID SURGERY     POLYPECTOMY  04/09/2023   Procedure: POLYPECTOMY;  Surgeon: Vinetta Greening, DO;  [facility];  Service: Endoscopy;;   PROSTATE BIOPSY      FAMILY HISTORY:  Family History  Problem Relation Age of Onset   Hypertension Other    Colon cancer Neg Hx     SOCIAL HISTORY:  Social History   Socioeconomic History   Marital status: Married    Spouse name: Not on file   Number of children: Not on file   Years of education: Not on file   Highest education level: Not on file  Occupational History   Not on file  Tobacco Use   Smoking status: Former    Current packs/day: 0.00    Types: Cigarettes    Quit date: 05/10/2002    Years since quitting: 21.3   Smokeless tobacco: Never  Vaping Use   Vaping status: Never Used  Substance  and Sexual Activity   Alcohol use: Never   Drug use: Yes    Types: Marijuana    Comment: once in a while   Sexual activity: Not on file  Other Topics Concern   Not on file  Social History Narrative   Not on file   Social Drivers of Health   Financial Resource Strain: Not on file  Food Insecurity: No Food Insecurity (08/26/2023)   Hunger Vital Sign    Worried About Running Out of Food in the Last Year: Never true    Ran Out of Food in the Last Year: Never true  Transportation Needs: No Transportation Needs (08/26/2023)   PRAPARE - Administrator, Civil Service (Medical): No    Lack of Transportation (Non-Medical): No  Physical Activity: Not on file  Stress: Not on file  Social Connections: Unknown (08/07/2021)   Received from [facility]   Social Network    Social Network: Not on file  Intimate Partner Violence: Not At Risk (08/26/2023)   Humiliation, Afraid, Rape, and Kick questionnaire    Fear of Current or Ex-Partner: No    Emotionally Abused: No    Physically Abused: No    Sexually Abused: No    ALLERGIES: Codeine, Rocephin  [ceftriaxone  sodium in dextrose], and Zithromax  [azithromycin ]  MEDICATIONS:  Current Outpatient Medications  Medication Sig Dispense Refill   acetaminophen  (TYLENOL ) 500 MG tablet Take 500 mg by mouth every 6 (six) hours as needed for fever.     albuterol  (PROVENTIL  HFA;VENTOLIN  HFA) 108 (90 Base) MCG/ACT inhaler Inhale 2 puffs into the lungs every 6 (six) hours as needed for wheezing or shortness of breath. 1 Inhaler 2   amLODipine -benazepril (LOTREL) 10-20 MG capsule Take 1 capsule by mouth daily.     chlorthalidone  (HYGROTON ) 25 MG tablet Take 25 mg by mouth daily.     finasteride (PROSCAR) 5 MG tablet Take 5 mg by mouth daily.     fluticasone (FLONASE) 50 MCG/ACT nasal spray      Omega-3 Fatty Acids (FISH OIL) 1000 MG CAPS Take 1 capsule by mouth daily.     omeprazole  (PRILOSEC) 20 MG capsule Take 1 capsule (20 mg total) by mouth 2  (two) times daily before a meal. 60 capsule 11   polyethylene glycol (MIRALAX ) 17 g packet Take 17 g by mouth daily. 30 each 1   potassium chloride  SA (KLOR-CON ) 20 MEQ tablet Take 1 tablet (20 mEq total) by mouth 2 (two) times daily for 7 days, THEN 1 tablet (20 mEq total) daily for 7 days. 21 tablet 0   rosuvastatin (CRESTOR) 40 MG tablet as needed.     sildenafil (REVATIO) 20 MG tablet 1-5 tablets as needed Orally Once a day for 14 days  tamsulosin  (FLOMAX ) 0.4 MG CAPS capsule Take 1 capsule by mouth daily.     tetrahydrozoline 0.05 % ophthalmic solution Place 2 drops into both eyes 2 (two) times daily as needed.     No current facility-administered medications for this encounter.    REVIEW OF SYSTEMS:  On review of systems, the patient reports that he is doing well overall. He denies any chest pain, shortness of breath, cough, fevers, chills, night sweats, unintended weight changes. He denies any bowel disturbances, and denies abdominal pain, nausea or vomiting. He denies any new musculoskeletal or joint aches or pains. His IPSS was 10, indicating mild-moderate urinary symptoms. His SHIM was 17, indicating he has mild erectile dysfunction. A complete review of systems is obtained and is otherwise negative.    PHYSICAL EXAM:  Wt Readings from Last 3 Encounters:  08/26/23 235 lb 6.4 oz (106.8 kg)  04/09/23 237 lb (107.5 kg)  03/08/23 237 lb 9.6 oz (107.8 kg)   Temp Readings from Last 3 Encounters:  08/26/23 97.9 F (36.6 C)  04/09/23 97.8 F (36.6 C) (Oral)  03/08/23 97.6 F (36.4 C)   BP Readings from Last 3 Encounters:  08/26/23 (!) 166/70  04/09/23 (!) 119/55  03/08/23 135/60   Pulse Readings from Last 3 Encounters:  08/26/23 88  04/09/23 69  03/08/23 60    /10  In general this is a well appearing African American man in no acute distress. He's alert and oriented x4 and appropriate throughout the examination. Cardiopulmonary assessment is negative for acute distress,  and he exhibits normal effort.     KPS = 100  100 - Normal; no complaints; no evidence of disease. 90   - Able to carry on normal activity; minor signs or symptoms of disease. 80   - Normal activity with effort; some signs or symptoms of disease. 64   - Cares for self; unable to carry on normal activity or to do active work. 60   - Requires occasional assistance, but is able to care for most of his personal needs. 50   - Requires considerable assistance and frequent medical care. 40   - Disabled; requires special care and assistance. 30   - Severely disabled; hospital admission is indicated although death not imminent. 20   - Very sick; hospital admission necessary; active supportive treatment necessary. 10   - Moribund; fatal processes progressing rapidly. 0     - Dead  Karnofsky DA, Abelmann WH, Craver LS and Burchenal [facility] [phone]) The use of the nitrogen mustards in the palliative treatment of carcinoma: with particular reference to bronchogenic carcinoma Cancer 1 634-56  LABORATORY DATA:  Lab Results  Component Value Date   WBC 5.6 01/15/2023   HGB 13.3 01/15/2023   HCT 40.8 01/15/2023   MCV 89.3 01/15/2023   PLT 209 01/15/2023   Lab Results  Component Value Date   NA 143 04/02/2023   K 3.6 04/02/2023   CL 102 04/02/2023   CO2 27 04/02/2023   Lab Results  Component Value Date   ALT 13 01/15/2023   AST 16 01/15/2023   ALKPHOS 61 01/15/2023   BILITOT 0.4 01/15/2023     RADIOGRAPHY: No results found.    IMPRESSION/PLAN: 1. 73 y.o. gentleman with Stage T1c adenocarcinoma of the prostate with Gleason score of 4+3, and PSA of 21, adjusted for finasteride. We discussed the patient's workup and outlined the nature of prostate cancer in this setting. The patient's T stage, Gleason's score, and PSA put him  into the high risk group. Accordingly, he is eligible for a variety of potential treatment options including prostatectomy or LT-ADT concurrent with either 8 weeks of external  radiation, or 5 weeks of external radiation with an upfront brachytherapy boost. We discussed the available radiation techniques, and focused on the details and logistics of delivery.  He is not an ideal candidate for brachytherapy boost with a prostate volume of 23 g.  Therefore, we discussed and outlined the risks, benefits, short and long-term effects associated with daily external beam radiotherapy and compared and contrasted these with prostatectomy. We discussed the role of SpaceOAR gel in reducing the rectal toxicity associated with radiotherapy. We also detailed the role of ADT in the treatment of high risk prostate cancer and outlined the associated side effects that could be expected with this therapy.  He appears to have a good understanding of his disease and our treatment recommendations which are of curative intent.  He was encouraged to ask questions that were answered to his stated satisfaction.  At the conclusion of our conversation, the patient is interested in moving forward with 8 weeks of external beam therapy concurrent with ADT. He has not started ADT so we will share our discussion with Dr. Freddi Jaeger and the [facility] and make arrangements for the start of ADT, first available.  We will also coordinate for fiducial markers and SpaceOAR gel placement in August or September 2025, prior to simulation, to reduce rectal toxicity from radiotherapy. The patient appears to have a good understanding of his disease and our treatment recommendations which are of curative intent and is in agreement with the stated plan.  Therefore, we will move forward with treatment planning accordingly, in anticipation of beginning IMRT approximately 2-3 months after starting ADT.  His wife is currently undergoing chemotherapy for ovarian cancer so he may elect to delay the start of daily radiation until she has completed her chemotherapy in September/October 2025.  We enjoyed meeting him today and look forward to continuing to  participate in his care.  We personally spent 70 minutes in this encounter including chart review, reviewing radiological studies, meeting face-to-face with the patient, entering orders and completing documentation.    Arta Bihari, PA-C    Kenith Payer, MD  [facility]  Radiation Oncology Direct Dial: [phone]  Fax: [phone] [email or web address]  Skype  LinkedIn   This document serves as a record of services personally performed by Kenith Payer, MD and Keitha Pata, PA-C. It was created on their behalf by Florance Hun, a trained medical scribe. The creation of this record is based on the scribe's personal observations and the provider's statements to them. This document has been checked and approved by the attending provider.

## 2023-08-26 ENCOUNTER — Ambulatory Visit
Admission: RE | Admit: 2023-08-26 | Discharge: 2023-08-26 | Disposition: A | Discharge status: Home / Self Care | Source: Ambulatory Visit | Attending: Radiation Oncology | Admitting: Radiation Oncology

## 2023-08-26 ENCOUNTER — Encounter: Payer: Self-pay | Admitting: Radiation Oncology

## 2023-08-26 VITALS — BP 166/70 | HR 88 | Temp 97.9°F | Resp 20 | Ht 70.0 in | Wt 235.4 lb

## 2023-08-26 DIAGNOSIS — Z79899 Other long term (current) drug therapy: Secondary | ICD-10-CM | POA: Insufficient documentation

## 2023-08-26 DIAGNOSIS — E785 Hyperlipidemia, unspecified: Secondary | ICD-10-CM | POA: Diagnosis not present

## 2023-08-26 DIAGNOSIS — Z87891 Personal history of nicotine dependence: Secondary | ICD-10-CM | POA: Diagnosis not present

## 2023-08-26 DIAGNOSIS — I10 Essential (primary) hypertension: Secondary | ICD-10-CM | POA: Insufficient documentation

## 2023-08-26 DIAGNOSIS — C61 Malignant neoplasm of prostate: Secondary | ICD-10-CM | POA: Insufficient documentation

## 2023-08-26 DIAGNOSIS — E669 Obesity, unspecified: Secondary | ICD-10-CM | POA: Diagnosis not present

## 2023-08-26 DIAGNOSIS — D509 Iron deficiency anemia, unspecified: Secondary | ICD-10-CM | POA: Diagnosis not present

## 2023-08-26 DIAGNOSIS — E559 Vitamin D deficiency, unspecified: Secondary | ICD-10-CM | POA: Insufficient documentation

## 2023-08-26 HISTORY — DX: Elevated prostate specific antigen (PSA): R97.20

## 2023-08-26 NOTE — Progress Notes (Signed)
 GU Location of Tumor / Histology: Prostate Ca  If Prostate Cancer, Gleason Score is (4 + 3) and PSA is (10.5 )  Jose Scott presented as referral from Dr. Doy Gene [facility]) elevated PSA.  Biopsies       07/18/2023 Dr. Doy Gene NM PET (PSMA) Skull to Mid Thigh CLINICAL DATA:  Prostate carcinoma with biochemical recurrence.   IMPRESSION: 1. Intense activity in the RIGHT lobe of the prostate gland consistent with primary prostate adenocarcinoma. 2. No evidence of metastatic adenopathy in the pelvis or periaortic retroperitoneum. 3. No evidence of visceral metastasis or skeletal metastasis.  02/12/2023 Dr. Christain Courser MR Prostate with/without Contrast  CLINICAL DATA:  Chronic elevated PSA. R97.20   IMPRESSION: 1. PI-RADS category 4 lesion of the right anterior transition zone in the base and mid gland with involvement of the right posterior transition zone in the base. This lesion has substantially restricted diffusion.  Past/Anticipated interventions by urology, if any:  NA  Past/Anticipated interventions by medical oncology, if any:  NA  Weight changes, if any:  No  IPSS:  10 SHIM:  17  Bowel/Bladder complaints, if any:  No  Nausea/Vomiting, if any: No  Pain issues, if any:  0/10  SAFETY ISSUES: Prior radiation?  No Pacemaker/ICD? No Possible current pregnancy? Male Is the patient on methotrexate? No  Current Complaints / other details:

## 2023-08-26 NOTE — Progress Notes (Signed)
 Introduced myself to the patient as the prostate nurse navigator.  No barriers to care identified at this time.  He is here to discuss his radiation treatment options.  I gave him my business card and asked him to call me with questions or concerns.  Verbalized understanding.  ?

## 2023-09-02 NOTE — Progress Notes (Signed)
 Patient is now scheduled for ADT with [facility] on 6/12 @ 2pm.  RN spoke with patient, and notified of appointment time and date.  All questions answered, plan of care in progress.

## 2023-09-06 NOTE — Progress Notes (Signed)
 RN spoke with patient, patient had to reschedule ADT appointment.  Patient is now scheduled for 6/18 @ 11:30am.  RN answered all questions, no additional needs at this time.

## 2023-09-12 NOTE — Progress Notes (Signed)
 Patient met with Dr. Freddi Jaeger at [facility] on 6/18 with plans to start Orgovyx for his ADT.   Pending [facility] approval of medication.   RN spoke with Doloris Freund, pharmacy tech, at [facility].  Patient is now scheduled for 6/20 at 10:30 to pick up 30 day supply of Orgovyx while we await [facility] approval.  Patient is aware.  Plan of care in progress.

## 2023-09-13 NOTE — Progress Notes (Signed)
 Patient received 30 day supply of Orgovyx at [facility] and will plan on starting medication 6/21.   RN spoke with patient and provided education on ADT, and daily radiation.  All questions answered.   Pending fiducial's, spaceOAR gel, and CT Simulation for approximately 2 - 3 months post ADT start date.    Plan of care in progress.

## 2023-10-11 ENCOUNTER — Other Ambulatory Visit: Payer: Self-pay | Admitting: Urology

## 2023-10-11 DIAGNOSIS — C61 Malignant neoplasm of prostate: Secondary | ICD-10-CM

## 2023-10-14 ENCOUNTER — Other Ambulatory Visit: Payer: Self-pay | Admitting: Urology

## 2023-10-17 NOTE — Progress Notes (Signed)
 Patient is now scheduled for fiducial's, and spaceOAR on 11/15/23 followed by CT Simulation/MRI on 11/22/23.   RN left message for call back to review any questions or barriers prior to start of radiation.

## 2023-11-12 ENCOUNTER — Encounter (HOSPITAL_COMMUNITY): Payer: Self-pay | Admitting: Urology

## 2023-11-12 NOTE — Progress Notes (Signed)
 Spoke w/ via phone for pre-op interview--- [facility] needs dos---- BMP and EKG per anesthesia.        Lab results------ COVID test -----patient states asymptomatic no test needed Arrive at -------0830 NPO after MN NO Solid Food.    Med rec completed Medications to take morning of surgery ----- Proscar and Orgovyx Diabetic medication -----  GLP1 agonist last dose: GLP1 instructions:  Patient instructed no nail polish to be worn day of surgery Patient instructed to bring photo id and insurance card day of surgery Patient aware to have Driver (ride ) / caregiver    for 24 hours after surgery - Son Sequoyah Counterman Patient Special Instructions ----- Fleet enema per surgeons instructions. Pre-Op special Instructions -----  Patient verbalized understanding of instructions that were given at this phone interview. Patient denies chest pain, sob, fever, cough at the interview.

## 2023-11-15 ENCOUNTER — Ambulatory Visit (HOSPITAL_COMMUNITY)
Admission: RE | Admit: 2023-11-15 | Discharge: 2023-11-15 | Disposition: A | Discharge status: Home / Self Care | Attending: Urology | Admitting: Urology

## 2023-11-15 ENCOUNTER — Ambulatory Visit (HOSPITAL_COMMUNITY)

## 2023-11-15 ENCOUNTER — Encounter (HOSPITAL_COMMUNITY): Payer: Self-pay | Admitting: Urology

## 2023-11-15 ENCOUNTER — Encounter (HOSPITAL_COMMUNITY)
Admission: RE | Disposition: A | Discharge status: Home / Self Care | Payer: Self-pay | Source: Home / Self Care | Attending: Urology

## 2023-11-15 ENCOUNTER — Ambulatory Visit (HOSPITAL_BASED_OUTPATIENT_CLINIC_OR_DEPARTMENT_OTHER)

## 2023-11-15 DIAGNOSIS — Z79818 Long term (current) use of other agents affecting estrogen receptors and estrogen levels: Secondary | ICD-10-CM | POA: Diagnosis not present

## 2023-11-15 DIAGNOSIS — C61 Malignant neoplasm of prostate: Secondary | ICD-10-CM | POA: Insufficient documentation

## 2023-11-15 DIAGNOSIS — Z87891 Personal history of nicotine dependence: Secondary | ICD-10-CM | POA: Diagnosis not present

## 2023-11-15 DIAGNOSIS — I10 Essential (primary) hypertension: Secondary | ICD-10-CM | POA: Insufficient documentation

## 2023-11-15 HISTORY — PX: GOLD SEED IMPLANT: SHX6343

## 2023-11-15 HISTORY — DX: Prediabetes: R73.03

## 2023-11-15 HISTORY — PX: SPACE OAR INSTILLATION: SHX6769

## 2023-11-15 LAB — BASIC METABOLIC PANEL WITH GFR
Anion gap: 12 (ref 5–15)
BUN: 19 mg/dL (ref 8–23)
CO2: 25 mmol/L (ref 22–32)
Calcium: 9.4 mg/dL (ref 8.9–10.3)
Chloride: 101 mmol/L (ref 98–111)
Creatinine, Ser: 1.34 mg/dL — ABNORMAL HIGH (ref 0.61–1.24)
GFR, Estimated: 56 mL/min — ABNORMAL LOW (ref >60)
Glucose, Bld: 124 mg/dL — ABNORMAL HIGH (ref 70–99)
Potassium: 3.4 mmol/L — ABNORMAL LOW (ref 3.5–5.1)
Sodium: 138 mmol/L (ref 135–145)

## 2023-11-15 SURGERY — INSERTION, GOLD SEEDS
Anesthesia: General

## 2023-11-15 MED ORDER — SUCCINYLCHOLINE CHLORIDE 200 MG/10ML IV SOSY
PREFILLED_SYRINGE | INTRAVENOUS | Status: DC | PRN
  Administered 2023-11-15: 200 mg via INTRAVENOUS

## 2023-11-15 MED ORDER — CHLORHEXIDINE GLUCONATE 0.12 % MT SOLN
15.0000 mL | Freq: Once | OROMUCOSAL | Status: AC
  Administered 2023-11-15: 15 mL via OROMUCOSAL

## 2023-11-15 MED ORDER — CIPROFLOXACIN IN D5W 400 MG/200ML IV SOLN
INTRAVENOUS | Status: AC
  Filled 2023-11-15: qty 200

## 2023-11-15 MED ORDER — ORAL CARE MOUTH RINSE: 15.0000 mL | Freq: Once | OROMUCOSAL | Status: AC

## 2023-11-15 MED ORDER — CIPROFLOXACIN IN D5W 400 MG/200ML IV SOLN
400.0000 mg | Freq: Two times a day (BID) | INTRAVENOUS | Status: DC
  Administered 2023-11-15: 400 mg via INTRAVENOUS

## 2023-11-15 MED ORDER — ACETAMINOPHEN 10 MG/ML IV SOLN: 1000.0000 mg | Freq: Once | INTRAVENOUS | Status: DC | PRN

## 2023-11-15 MED ORDER — CHLORHEXIDINE GLUCONATE 0.12 % MT SOLN
OROMUCOSAL | Status: AC
  Filled 2023-11-15: qty 15

## 2023-11-15 MED ORDER — DEXAMETHASONE SODIUM PHOSPHATE 10 MG/ML IJ SOLN
INTRAMUSCULAR | Status: DC | PRN
  Administered 2023-11-15: 5 mg via INTRAVENOUS

## 2023-11-15 MED ORDER — PROPOFOL 10 MG/ML IV BOLUS
INTRAVENOUS | Status: DC | PRN
  Administered 2023-11-15 (×5): 100 mg via INTRAVENOUS

## 2023-11-15 MED ORDER — ONDANSETRON HCL 4 MG/2ML IJ SOLN
INTRAMUSCULAR | Status: DC | PRN
  Administered 2023-11-15: 4 mg via INTRAVENOUS

## 2023-11-15 MED ORDER — LACTATED RINGERS IV SOLN: INTRAVENOUS | Status: DC

## 2023-11-15 MED ORDER — BUPIVACAINE HCL (PF) 0.25 % IJ SOLN
INTRAMUSCULAR | Status: DC | PRN
  Administered 2023-11-15: 10 mL

## 2023-11-15 MED ORDER — ONDANSETRON HCL 4 MG/2ML IJ SOLN
INTRAMUSCULAR | Status: AC
  Filled 2023-11-15: qty 2

## 2023-11-15 MED ORDER — SODIUM CHLORIDE (PF) 0.9 % IJ SOLN
INTRAMUSCULAR | Status: DC | PRN
  Administered 2023-11-15: 10 mL

## 2023-11-15 MED ORDER — SUCCINYLCHOLINE CHLORIDE 200 MG/10ML IV SOSY
PREFILLED_SYRINGE | INTRAVENOUS | Status: AC
  Filled 2023-11-15: qty 10

## 2023-11-15 MED ORDER — ALBUTEROL SULFATE HFA 108 (90 BASE) MCG/ACT IN AERS
INHALATION_SPRAY | RESPIRATORY_TRACT | Status: DC | PRN
Start: 2023-11-15 — End: 2023-11-15
  Administered 2023-11-15: 2 via RESPIRATORY_TRACT

## 2023-11-15 MED ORDER — FENTANYL CITRATE (PF) 100 MCG/2ML IJ SOLN: 25.0000 ug | INTRAMUSCULAR | Status: DC | PRN

## 2023-11-15 MED ORDER — ALBUTEROL SULFATE HFA 108 (90 BASE) MCG/ACT IN AERS
INHALATION_SPRAY | RESPIRATORY_TRACT | Status: AC
  Filled 2023-11-15: qty 6.7

## 2023-11-15 MED ORDER — DEXAMETHASONE SODIUM PHOSPHATE 10 MG/ML IJ SOLN
INTRAMUSCULAR | Status: AC
  Filled 2023-11-15: qty 1

## 2023-11-15 MED ORDER — LIDOCAINE 2% (20 MG/ML) 5 ML SYRINGE
INTRAMUSCULAR | Status: DC | PRN
  Administered 2023-11-15: 100 mg via INTRAVENOUS

## 2023-11-15 SURGICAL SUPPLY — 21 items
BLADE CLIPPER SENSICLIP SURGIC (BLADE) ×1 IMPLANT
CNTNR URN SCR LID CUP LEK RST (MISCELLANEOUS) ×1 IMPLANT
COVER BACK TABLE 60X90IN (DRAPES) ×1 IMPLANT
DRSG TEGADERM 4X4.75 (GAUZE/BANDAGES/DRESSINGS) ×1 IMPLANT
DRSG TEGADERM 8X12 (GAUZE/BANDAGES/DRESSINGS) ×1 IMPLANT
GAUZE SPONGE 4X4 12PLY STRL (GAUZE/BANDAGES/DRESSINGS) ×1 IMPLANT
GLOVE BIO SURGEON STRL SZ7.5 (GLOVE) ×1 IMPLANT
IMPL SPACEOAR SYSTEM 10ML (Spacer) ×1 IMPLANT
KIT TURNOVER KIT B (KITS) ×1 IMPLANT
MARKER GOLD PRELOAD 1.2X3 (Urological Implant) ×1 IMPLANT
MARKER SKIN DUAL TIP RULER LAB (MISCELLANEOUS) ×1 IMPLANT
NDL SPNL 22GX3.5 QUINCKE BK (NEEDLE) ×1 IMPLANT
NEEDLE SPNL 22GX3.5 QUINCKE BK (NEEDLE) ×1 IMPLANT
SHEATH ULTRASOUND LF (SHEATH) IMPLANT
SHEATH ULTRASOUND LTX NONSTRL (SHEATH) IMPLANT
SLEEVE SCD COMPRESS KNEE MED (STOCKING) ×1 IMPLANT
SURGILUBE 2OZ TUBE FLIPTOP (MISCELLANEOUS) ×1 IMPLANT
SYR 10ML LL (SYRINGE) ×1 IMPLANT
SYR CONTROL 10ML LL (SYRINGE) ×1 IMPLANT
TOWEL GREEN STERILE (TOWEL DISPOSABLE) ×1 IMPLANT
UNDERPAD 30X36 HEAVY ABSORB (UNDERPADS AND DIAPERS) ×1 IMPLANT

## 2023-11-15 NOTE — H&P (Signed)
 Office Visit Report     11/05/2023   --------------------------------------------------------------------------------   Jose Scott  MRN: [id number]  DOB: [birth year], 73 year old Male  SSN: 70   PRIMARY CARE:     REFERRING:  Donnice FABIENE Siad, MD  PROVIDER:  Donnice Siad, M.D.  TREATING:  Ubaldo Eagles, NP  LOCATION:  [facility] [phone]     --------------------------------------------------------------------------------   CC/HPI: 11/05/2023: Patient with below noted history. Here today for preoperative appointment prior to undergoing fiducial marker placement and SpaceOAR on 8/22 in anticipation of beginning EBRT under the guidance of radiation oncology for management of his underlying prostate cancer diagnosis. He has also started long-term androgen deprivation therapy in the form of Orgovyx. He has had some expected fatigue and hot flashes but this has not been detrimental. He is still able to carry out all normal daily activities of living. He is also exercising and trying to eat a good diet, stay well-hydrated. He is not taking a calcium or vitamin D supplement so that was discussed in detail and recommended today. Patient denies any other changes in past medical history, prescription medications taken on a daily basis, no interval surgical or procedural intervention. He continues to void at his baseline maintained with continued use of both tamsulosin  and finasteride. He denies any recent dysuria, gross hematuria, no interval treatment for UTI. He denies any recent chest pain, shortness of breath, fever/chills or nausea/vomiting.   Jose Scott is a 73 year old male who is seen to discuss his new diagnosis of prostate cancer and definitive treatment options.   1. Localized high risk prostate cancer:  -Prostate MRI 01/2023: PI-RADS category 4 lesion the right anterior transition zone. Prostate volume 22 cc. No extraprostatic findings.  Patient underwent MRI  fusion prostate biopsy on 06/12/2023 for an elevated PSA of 10.5 (21 with finasteride) ng/mL. Biopsy revealed GS 4+3 = 7 in 3/4 cores at the ROI and no other cores postive with one core atypical, adenocarcinoma of the prostate with 3/16 total cores positive (40%), TRUS volume of 22 cm3. Denies new or worsening bone or back pain. Good appetite and stable weight.   Family history: Denies  Imaging studies:  Prostate MRI 01/2023: PI-RADS category 4 lesion the right anterior transition zone. Prostate volume 22 cc. No extraprostatic findings.  PSMA PET scan 07/18/2023: Activity in the right lobe of the prostate however no evidence of metastatic involvement.   PMH: Hypertension, hyperlipidemia. He denies taking anticoagulation.  PSH: Denies prior abdominal surgeries. He has had a prior hernia repair.   TNM stage: [id number]  PSA: 10.5  Gleason score: 4+3=7  Biopsy: 06/12/2023  Left: 1 core ASAP at the left mid lateral.  Right: 4+3 = 7 in 3/4 cores at the ROI. All other cores benign.  Prostate volume: 22 cc  PSAD: 0.47   Nomogram  CSS (15 year): 95%  PFS (5 year, 10 year): 60%, 45%  EPE: 53%  LNI: 9%  SVI: 7%   IPSS: No significant voiding difficulties.  SHIM: 8, not interested in ED treatment. Of note, his wife has history of ovarian cancer. Localized unfavorable intermediate risk prostate cancer   He has met with Dr. Patrcia and has elected to proceed with EBRT plus LT-ADT.     ALLERGIES: Codeine    MEDICATIONS: Tamsulosin  HCl 0.4 MG Capsule  Chlorthalidone  25 MG Tablet  Fish Oil  Lotrel 10-20 MG Capsule  Orgovyx 120 MG Tablet 1 tablet PO Daily     GU PSH: Prostate  Needle Biopsy - 06/12/2023     NON-GU PSH: Cataract surgery Hemorrhoidectomy Hernia Repair Surgical Pathology, Gross And Microscopic Examination For Prostate Needle - 06/12/2023 Visit Complexity (formerly GPC1X) - 09/11/2023, 06/25/2023, 05/13/2023     GU PMH: Prostate Cancer - 09/13/2023, - 09/11/2023, - 06/25/2023 BPH  w/LUTS - 09/11/2023, - 06/25/2023, - 06/12/2023, - 05/13/2023, - 08/23/2022, - 02/09/2022, - 2023, - 2022, - 2022, - 2022 Weak Urinary Stream - 09/11/2023, - 06/25/2023, - 06/12/2023, - 05/13/2023, - 08/23/2022, - 02/09/2022, - 2023, - 2022, - 2022, - 2022 Elevated PSA - 06/12/2023, - 05/13/2023, - 08/23/2022, - 02/09/2022, - 2023, - 2022, - 2022, - 2022    NON-GU PMH: Gout Hypercholesterolemia Hypertension    FAMILY HISTORY: 1 son - Runs in Family   SOCIAL HISTORY: Marital Status: Married Preferred Language: English; Ethnicity: Not Hispanic Or Latino; Race: Black or African American Current Smoking Status: Patient does not smoke anymore. Has not smoked since 03/26/2004.   Tobacco Use Assessment Completed: Used Tobacco in last 30 days? Does not use smokeless tobacco. Does not use drugs. Drinks 2 caffeinated drinks per day. Has not had a blood transfusion.    REVIEW OF SYSTEMS:    GU Review Male:   Patient reports frequent urination and get up at night to urinate. Patient denies hard to postpone urination, burning/ pain with urination, leakage of urine, stream starts and stops, trouble starting your stream, have to strain to urinate , erection problems, and penile pain.  Gastrointestinal (Upper):   Patient denies nausea, vomiting, and indigestion/ heartburn.  Gastrointestinal (Lower):   Patient denies diarrhea and constipation.  Constitutional:   Hot flashes.  Patient reports fatigue. Patient denies fever, night sweats, and weight loss.  Skin:   Patient denies skin rash/ lesion and itching.  Eyes:   Patient denies blurred vision and double vision.  Ears/ Nose/ Throat:   Patient denies sore throat and sinus problems.  Hematologic/Lymphatic:   Patient denies swollen glands and easy bruising.  Cardiovascular:   Patient denies leg swelling and chest pains.  Respiratory:   Patient denies cough and shortness of breath.  Endocrine:   Patient denies excessive thirst.  Musculoskeletal:   Patient denies back  pain and joint pain.  Neurological:   Patient denies headaches and dizziness.  Psychologic:   Patient denies depression and anxiety.   Notes: weak stream Updated from previous visit 02/09/2022 with review from patient as noted above.   VITAL SIGNS:      11/05/2023 10:56 AM  Weight 238 lb / 107.95 kg  Height 70 in / 177.8 cm  BP 135/65 mmHg  Pulse 76 /min  Temperature 97.9 F / 36.6 C  BMI 34.1 kg/m   MULTI-SYSTEM PHYSICAL EXAMINATION:    Constitutional: Well-nourished. No physical deformities. Normally developed. Good grooming.  Neck: Neck symmetrical, not swollen. Normal tracheal position.  Respiratory: No labored breathing, no use of accessory muscles.   Cardiovascular: Normal temperature, normal extremity pulses, no swelling, no varicosities.  Skin: No paleness, no jaundice, no cyanosis. No lesion, no ulcer, no rash.  Neurologic / Psychiatric: Oriented to time, oriented to place, oriented to person. No depression, no anxiety, no agitation.  Gastrointestinal: No mass, no tenderness, no rigidity, non obese abdomen.  Musculoskeletal: Normal gait and station of head and neck.     Complexity of Data:  Source Of History:  Patient, Medical Record Summary  Lab Test Review:   PSA  Records Review:   Pathology Reports, Previous Doctor Records, Previous Hospital Records,  Previous Patient Records  Urine Test Review:   Urinalysis  X-Ray Review: PET- PSMA Scan: Reviewed Report.     08/16/22 02/02/22 02/13/21 08/11/20  PSA  Total PSA 9.47 ng/mL 7.88 ng/mL 7.91 ng/mL 7.53 ng/mL  Free PSA 0.81 ng/mL 0.65 ng/mL 0.55 ng/mL 0.60 ng/mL  % Free PSA 9 % PSA 8 % PSA 7 % PSA 8 % PSA    11/05/23  Urinalysis  Urine Appearance Clear   Urine Color Straw   Urine Glucose Neg mg/dL  Urine Bilirubin Neg mg/dL  Urine Ketones Neg mg/dL  Urine Specific Gravity 1.015   Urine Blood Neg ery/uL  Urine pH 6.5   Urine Protein Neg mg/dL  Urine Urobilinogen 0.2 mg/dL  Urine Nitrites Neg   Urine Leukocyte  Esterase Neg leu/uL   PROCEDURES:          Visit Complexity - G2211          Urinalysis Dipstick Dipstick Cont'd  Color: Straw Bilirubin: Neg mg/dL  Appearance: Clear Ketones: Neg mg/dL  Specific Gravity: 8.984 Blood: Neg ery/uL  pH: 6.5 Protein: Neg mg/dL  Glucose: Neg mg/dL Urobilinogen: 0.2 mg/dL    Nitrites: Neg    Leukocyte Esterase: Neg leu/uL    ASSESSMENT:      ICD-10 Details  1 GU:   Prostate Cancer - C61 Chronic, Threat to Bodily Function  2 NON-GU:   Encounter for other preprocedural examination - Z01.818 Undiagnosed New Problem   PLAN:           Orders Labs Urine Culture          Schedule Return Visit/Planned Activity: Keep Scheduled Appointment - Follow up MD, Schedule Surgery          Document Letter(s):  Created for Patient: Clinical Summary         Notes:   All questions answered to the best of my ability regarding the upcoming procedure and expected postoperative course with understanding expressed by the patient and his wife. Precautionary urine culture sent today to serve as a baseline. He will proceed with previously scheduled fiducial marker placement and SpaceOAR on 8/22 with Dr. Selma.        Next Appointment:      Next Appointment: 11/15/2023 10:30 AM    Appointment Type: Surgery     Location: [facility] [phone]    Provider: Donnice Selma, M.D.    Reason for Visit: MC/OP FIDUCIAL MARKERS AND SPACE OAR    Urology Preoperative H&P   Chief Complaint: High risk prostate cancer  History of Present Illness: Jose Scott is a 73 y.o. male with localized high risk prostate cancer here for fiducial marker and SpaceOAR placement. Denies fevers, chills, dysuria.    Past Medical History:  Diagnosis Date   Elevated PSA    Enlarged prostate    Hyperlipidemia    Hypertension    IDA (iron deficiency anemia)    Obesity    Pneumonia    Pre-diabetes    Prediabetes    Vitamin D deficiency     Past Surgical History:   Procedure Laterality Date   BIOPSY  04/09/2023   Procedure: BIOPSY;  Surgeon: Cindie Jose POUR, DO;  Location: [facility];  Service: Endoscopy;;   CATARACT EXTRACTION     COLONOSCOPY WITH PROPOFOL  N/A 04/09/2023   Procedure: COLONOSCOPY WITH PROPOFOL ;  Surgeon: Cindie Jose POUR, DO;  Location: [facility];  Service: Endoscopy;  Laterality: N/A;  10:30AM, ASA 2   ESOPHAGOGASTRODUODENOSCOPY (EGD)  WITH PROPOFOL  N/A 04/09/2023   Procedure: ESOPHAGOGASTRODUODENOSCOPY (EGD) WITH PROPOFOL ;  Surgeon: Cindie Jose POUR, DO;  Location: [facility];  Service: Endoscopy;  Laterality: N/A;   HEMORRHOID SURGERY     POLYPECTOMY  04/09/2023   Procedure: POLYPECTOMY;  Surgeon: Cindie Jose POUR, DO;  Location: [facility];  Service: Endoscopy;;   PROSTATE BIOPSY      Allergies:  Allergies  Allergen Reactions   Codeine    Rocephin  [Ceftriaxone  Sodium In Dextrose] Hives   Zithromax  [Azithromycin ] Hives    Family History  Problem Relation Age of Onset   Hypertension Other    Colon cancer Neg Hx     Social History:  reports that he quit smoking about 21 years ago. His smoking use included cigarettes. He has never used smokeless tobacco. He reports current drug use. Drug: Marijuana. He reports that he does not drink alcohol.  ROS: A complete review of systems was performed.  All systems are negative except for pertinent findings as noted.  Physical Exam:  Vital signs in last 24 hours: Temp:  [98.3 F (36.8 C)] 98.3 F (36.8 C) (08/22 0836) Pulse Rate:  [85] 85 (08/22 0836) Resp:  [18] 18 (08/22 0836) BP: (161)/(80) 161/80 (08/22 0836) SpO2:  [91 %] 91 % (08/22 0836) Weight:  [891 kg] 108 kg (08/22 0836) Constitutional:  Alert and oriented, No acute distress Cardiovascular: Regular rate and rhythm Respiratory: Normal respiratory effort, Lungs clear bilaterally GI: Abdomen is soft, nontender, nondistended, no abdominal masses GU: No CVA tenderness Lymphatic: No  lymphadenopathy Neurologic: Grossly intact, no focal deficits Psychiatric: Normal mood and affect  Laboratory Data:  No results for input(s): WBC, HGB, HCT, PLT in the last 72 hours.  No results for input(s): NA, K, CL, GLUCOSE, BUN, CALCIUM, CREATININE in the last 72 hours.  Invalid input(s): CO3   No results found for this or any previous visit (from the past 24 hours). No results found for this or any previous visit (from the past 240 hours).  Renal Function: No results for input(s): CREATININE in the last 168 hours. CrCl cannot be calculated (Patient's most recent lab result is older than the maximum 21 days allowed.).  Radiologic Imaging: No results found.  I independently reviewed the above imaging studies.  Assessment and Plan Jose Scott is a 73 y.o. male with localized high risk prostate cancer here for fiducial marker and SpaceOAR placement.    Matt R. Roscoe Witts MD 11/15/2023, 8:39 AM  [facility] Pager: [phone]): [phone]

## 2023-11-15 NOTE — Transfer of Care (Signed)
 Immediate Anesthesia Transfer of Care Note  Patient: Jose Scott  Procedure(s) Performed: INSERTION, GOLD SEEDS INJECTION, HYDROGEL SPACER  Patient Location: PACU  Anesthesia Type:General  Level of Consciousness: patient cooperative  Airway & Oxygen  Therapy: Patient Spontanous Breathing and Patient connected to face mask oxygen   Post-op Assessment: Report given to RN, Post -op Vital signs reviewed and stable, Patient moving all extremities X 4, and Patient able to stick tongue midline  Post vital signs: Reviewed and stable  Last Vitals:  Vitals Value Taken Time  BP 133/61 11/15/23 10:24  Temp 97.6   Pulse 72 11/15/23 10:25  Resp 24 11/15/23 10:25  SpO2 99 % 11/15/23 10:25  Vitals shown include unfiled device data.  Last Pain:  Vitals:   11/15/23 0836  TempSrc: Oral  PainSc: 0-No pain      Patients Stated Pain Goal: 3 (11/15/23 0836)  Complications: No notable events documented.

## 2023-11-15 NOTE — Discharge Instructions (Addendum)
 Activity:  You are encouraged to ambulate frequently (about every hour during waking hours) to help prevent blood clots from forming in your legs or lungs.  However, you should not engage in any heavy lifting (> 10-15 lbs), strenuous activity, or straining.  Diet: You should advance your diet as instructed by your physician.  It will be normal to have some bloating, nausea, and abdominal discomfort intermittently.  Prescriptions:  You will be provided a prescription for pain medication to take as needed.  If your pain is not severe enough to require the prescription pain medication, you may take extra strength Tylenol instead which will have less side effects.  You should also take a prescribed stool softener to avoid straining with bowel movements as the prescription pain medication may constipate you. What to call us about: You should call the office [phone]) if you develop fever > 101 or develop persistent vomiting. Activity:  You are encouraged to ambulate frequently (about every hour during waking hours) to help prevent blood clots from forming in your legs or lungs.  However, you should not engage in any heavy lifting (> 10-15 lbs), strenuous activity, or straining.   Post Anesthesia Home Care Instructions  Activity: Get plenty of rest for the remainder of the day. A responsible individual must stay with you for 24 hours following the procedure.  For the next 24 hours, DO NOT: -Drive a car -Advertising copywriter -Drink alcoholic beverages -Take any medication unless instructed by your physician -Make any legal decisions or sign important papers.  Meals: Start with liquid foods such as gelatin or soup. Progress to regular foods as tolerated. Avoid greasy, spicy, heavy foods. If nausea and/or vomiting occur, drink only clear liquids until the nausea and/or vomiting subsides. Call your physician if vomiting continues.  Special Instructions/Symptoms: Your throat may feel dry or sore from  the anesthesia or the breathing tube placed in your throat during surgery. If this causes discomfort, gargle with warm salt water. The discomfort should disappear within 24 hours.

## 2023-11-15 NOTE — Anesthesia Procedure Notes (Signed)
 Procedure Name: MAC Date/Time: 11/15/2023 9:35 AM  Performed by: Viviana Almarie DASEN, CRNAPre-anesthesia Checklist: Patient identified, Suction available, Patient being monitored, Emergency Drugs available and Timeout performed Patient Re-evaluated:Patient Re-evaluated prior to induction Oxygen  Delivery Method: Nasal cannula Induction Type: IV induction Placement Confirmation: positive ETCO2

## 2023-11-15 NOTE — Anesthesia Preprocedure Evaluation (Signed)
 Anesthesia Evaluation  Patient identified by MRN, date of birth, ID band Patient awake    Reviewed: Allergy & Precautions, NPO status , Patient's Chart, lab work & pertinent test results  Airway Mallampati: II  TM Distance: >3 FB Neck ROM: Full    Dental no notable dental hx. (+) Upper Dentures, Lower Dentures   Pulmonary former smoker   Pulmonary exam normal        Cardiovascular hypertension, Pt. on medications  Rhythm:Regular Rate:Normal     Neuro/Psych negative neurological ROS  negative psych ROS   GI/Hepatic Neg liver ROS,GERD  Medicated,,  Endo/Other  negative endocrine ROS    Renal/GU    Prostate Ca    Musculoskeletal negative musculoskeletal ROS (+)    Abdominal Normal abdominal exam  (+)   Peds  Hematology  (+) Blood dyscrasia, anemia Lab Results      Component                Value               Date                      WBC                      5.6                 01/15/2023                HGB                      13.3                01/15/2023                HCT                      40.8                01/15/2023                MCV                      89.3                01/15/2023                PLT                      209                 01/15/2023              Anesthesia Other Findings   Reproductive/Obstetrics                              Anesthesia Physical Anesthesia Plan  ASA: 3  Anesthesia Plan: MAC   Post-op Pain Management:    Induction: Intravenous  PONV Risk Score and Plan: 1 and Ondansetron , Dexamethasone , Propofol  infusion and Treatment may vary due to age or medical condition  Airway Management Planned: Simple Face Mask and Nasal Cannula  Additional Equipment: None  Intra-op Plan:   Post-operative Plan:   Informed Consent: I have reviewed the patients History and Physical, chart, labs and discussed the procedure including the risks, benefits  and alternatives for the  proposed anesthesia with the patient or authorized representative who has indicated his/her understanding and acceptance.     Dental advisory given  Plan Discussed with: CRNA  Anesthesia Plan Comments:         Anesthesia Quick Evaluation

## 2023-11-15 NOTE — Anesthesia Procedure Notes (Signed)
 Procedure Name: Intubation Date/Time: 11/15/2023 9:45 AM  Performed by: Viviana Almarie DASEN, CRNAPre-anesthesia Checklist: Patient identified, Emergency Drugs available, Suction available and Patient being monitored Patient Re-evaluated:Patient Re-evaluated prior to induction Oxygen  Delivery Method: Circle System Utilized Preoxygenation: Pre-oxygenation with 100% oxygen  Induction Type: IV induction Ventilation: Mask ventilation without difficulty Laryngoscope Size: Mac and 3 Grade View: Grade I Tube type: Oral Number of attempts: 1 Airway Equipment and Method: Stylet and Oral airway Placement Confirmation: ETT inserted through vocal cords under direct vision, positive ETCO2 and breath sounds checked- equal and bilateral Secured at: 23 cm Tube secured with: Tape Dental Injury: Teeth and Oropharynx as per pre-operative assessment

## 2023-11-15 NOTE — Op Note (Signed)
 Operative Note  Preoperative diagnosis:  1.  Clinically localized adenocarcinoma of the prostate (localized high risk)  Postoperative diagnosis: 1.  Clinically localized adenocarcinoma of the prostate  Procedure(s): 1. Placement of fiducial markers into prostate 2. Insertion of SpaceOAR hydrogel   Surgeon: Donnice Siad, MD  Assistants:  None  Anesthesia:  General  Complications:  None  EBL:  Minimal  Specimens: 1. None  Drains/Catheters: 1.  None  Indication:  Jose Scott is a 73 y.o. male with clinically localized prostate cancer. After discussing management options for treatment, he elected to proceed with radiotherapy. He presents today for the above procedures. The potential risks, complications, alternative options, and expected recovery course have been discussed in detail with the patient and he has provided informed consent to proceed.  Description of procedure: The patient was administered preoperative antibiotics, placed in the dorsal lithotomy position, and prepped and draped in the usual sterile fashion. On examination, he had a large prolapsed hemorrhoid. Next, transrectal ultrasonography was utilized to visualize the prostate. Three gold fiducial markers were then placed into the prostate via transperineal needles under ultrasound guidance at the right apex, right base, and left mid gland under direct ultrasound guidance. A site in the midline was then selected on the perineum for placement of an 18 g needle with saline. The needle was advanced above the rectum and below Denonvillier's fascia to the mid gland and confirmed to be in the midline on transverse imaging. One cc of saline was injected confirming appropriate expansion of this space. A total of 5 cc of saline was then injected to open the space further bilaterally. The saline syringe was then removed and the SpaceOAR hydrogel was injected with good distribution bilaterally. He tolerated the procedure well and  without complications. He was given a voiding trial prior to discharge from the PACU.  [name] MD [facility]  Pager: [phone]

## 2023-11-15 NOTE — Anesthesia Postprocedure Evaluation (Signed)
 Anesthesia Post Note  Patient: Jose Scott  Procedure(s) Performed: INSERTION, GOLD SEEDS INJECTION, HYDROGEL SPACER     Patient location during evaluation: PACU Anesthesia Type: General Level of consciousness: awake and alert, oriented and patient cooperative Pain management: pain level controlled Vital Signs Assessment: post-procedure vital signs reviewed and stable Respiratory status: spontaneous breathing, nonlabored ventilation and respiratory function stable Cardiovascular status: blood pressure returned to baseline and stable Postop Assessment: no apparent nausea or vomiting Anesthetic complications: no   No notable events documented.  Last Vitals:  Vitals:   11/15/23 1024 11/15/23 1030  BP: 133/61 (!) 133/51  Pulse: 69 70  Resp: (!) 24 20  Temp: 36.4 C   SpO2: 99% 98%    Last Pain:  Vitals:   11/15/23 1024  TempSrc:   PainSc: Asleep                 Joylyn Duggin,E. Kuuipo Anzaldo

## 2023-11-18 ENCOUNTER — Encounter (HOSPITAL_COMMUNITY): Payer: Self-pay | Admitting: Urology

## 2023-11-20 ENCOUNTER — Telehealth: Payer: Self-pay | Admitting: *Deleted

## 2023-11-20 NOTE — Telephone Encounter (Signed)
 CALLED PATIENT TO REMIND OF SIM AND MRI FOR 11/22/23, LVM FOR A RETURN CALL

## 2023-11-20 NOTE — Telephone Encounter (Signed)
 CALLED PATIENT TO REMIND OF SIM AND MRI FOR 11-22-23, SPOKE WITH PATIENT AND HE IS AWARE OF THESE APPTS. AND THE INSTRUCTIONS

## 2023-11-21 NOTE — Progress Notes (Signed)
  Radiation Oncology         [phone] ________________________________  Name: Jose Scott MRN: [id number]  Date: 11/22/2023  DOB: September 29, 1950  SIMULATION AND TREATMENT PLANNING NOTE    ICD-10-CM   1. Malignant neoplasm of prostate (HCC)  C61       DIAGNOSIS:   73 y.o. gentleman with Stage T1c adenocarcinoma of the prostate with Gleason score of 4+3, and PSA of 21, adjusted for finasteride.   NARRATIVE:  The patient was brought to the CT Simulation planning suite.  Identity was confirmed.  All relevant records and images related to the planned course of therapy were reviewed.  The patient freely provided informed written consent to proceed with treatment after reviewing the details related to the planned course of therapy. The consent form was witnessed and verified by the simulation staff.  Then, the patient was set-up in a stable reproducible supine position for radiation therapy.  A vacuum lock pillow device was custom fabricated to position his legs in a reproducible immobilized position.  Then, I performed a urethrogram under sterile conditions to identify the prostatic apex.  CT images were obtained.  Surface markings were placed.  The CT images were loaded into the planning software.  Then the prostate target and avoidance structures including the rectum, bladder, bowel and hips were contoured.  Treatment planning then occurred.  The radiation prescription was entered and confirmed.  A total of one complex treatment devices was fabricated. I have requested : Intensity Modulated Radiotherapy (IMRT) is medically necessary for this case for the following reason:  Rectal sparing.SABRA  PLAN:   The prostate, seminal vesicles, and pelvic lymph nodes will initially be treated to 45 Gy in 25 fractions of 1.8 Gy followed by a boost to the prostate only, to 75 Gy with 15 additional fractions of 2.0 Gy; concurrent with LT-ADT (Orgovyx).  ________________________________  Donnice FELIX Patrcia, M.D.

## 2023-11-22 ENCOUNTER — Ambulatory Visit
Admission: RE | Admit: 2023-11-22 | Discharge: 2023-11-22 | Disposition: A | Discharge status: Home / Self Care | Source: Ambulatory Visit | Attending: Radiation Oncology | Admitting: Radiation Oncology

## 2023-11-22 ENCOUNTER — Ambulatory Visit (HOSPITAL_COMMUNITY)
Admission: RE | Admit: 2023-11-22 | Discharge: 2023-11-22 | Disposition: A | Discharge status: Home / Self Care | Source: Ambulatory Visit | Attending: Urology | Admitting: Urology

## 2023-11-22 DIAGNOSIS — C61 Malignant neoplasm of prostate: Secondary | ICD-10-CM | POA: Insufficient documentation

## 2023-11-26 DIAGNOSIS — C61 Malignant neoplasm of prostate: Secondary | ICD-10-CM | POA: Insufficient documentation

## 2023-12-09 ENCOUNTER — Other Ambulatory Visit: Payer: Self-pay

## 2023-12-09 ENCOUNTER — Ambulatory Visit

## 2023-12-09 ENCOUNTER — Ambulatory Visit
Admission: RE | Admit: 2023-12-09 | Discharge: 2023-12-09 | Disposition: A | Discharge status: Home / Self Care | Source: Ambulatory Visit | Attending: Radiation Oncology | Admitting: Radiation Oncology

## 2023-12-09 DIAGNOSIS — C61 Malignant neoplasm of prostate: Secondary | ICD-10-CM | POA: Diagnosis not present

## 2023-12-09 LAB — RAD ONC ARIA SESSION SUMMARY
Course Elapsed Days: 0
Plan Fractions Treated to Date: 1
Plan Prescribed Dose Per Fraction: 1.8 Gy
Plan Total Fractions Prescribed: 25
Plan Total Prescribed Dose: 45 Gy
Reference Point Dosage Given to Date: 1.8 Gy
Reference Point Session Dosage Given: 1.8 Gy
Session Number: 1

## 2023-12-10 ENCOUNTER — Ambulatory Visit

## 2023-12-11 ENCOUNTER — Ambulatory Visit

## 2023-12-11 ENCOUNTER — Ambulatory Visit
Admission: RE | Admit: 2023-12-11 | Discharge: 2023-12-11 | Disposition: A | Discharge status: Home / Self Care | Source: Ambulatory Visit | Attending: Radiation Oncology | Admitting: Radiation Oncology

## 2023-12-11 ENCOUNTER — Other Ambulatory Visit: Payer: Self-pay

## 2023-12-11 DIAGNOSIS — C61 Malignant neoplasm of prostate: Secondary | ICD-10-CM | POA: Diagnosis not present

## 2023-12-11 LAB — RAD ONC ARIA SESSION SUMMARY
Course Elapsed Days: 2
Plan Fractions Treated to Date: 2
Plan Prescribed Dose Per Fraction: 1.8 Gy
Plan Total Fractions Prescribed: 25
Plan Total Prescribed Dose: 45 Gy
Reference Point Dosage Given to Date: 3.6 Gy
Reference Point Session Dosage Given: 1.8 Gy
Session Number: 2

## 2023-12-12 ENCOUNTER — Other Ambulatory Visit: Payer: Self-pay

## 2023-12-12 ENCOUNTER — Ambulatory Visit
Admission: RE | Admit: 2023-12-12 | Discharge: 2023-12-12 | Disposition: A | Discharge status: Home / Self Care | Source: Ambulatory Visit | Attending: Radiation Oncology | Admitting: Radiation Oncology

## 2023-12-12 ENCOUNTER — Ambulatory Visit

## 2023-12-12 DIAGNOSIS — C61 Malignant neoplasm of prostate: Secondary | ICD-10-CM | POA: Diagnosis not present

## 2023-12-12 LAB — RAD ONC ARIA SESSION SUMMARY
Course Elapsed Days: 3
Plan Fractions Treated to Date: 3
Plan Prescribed Dose Per Fraction: 1.8 Gy
Plan Total Fractions Prescribed: 25
Plan Total Prescribed Dose: 45 Gy
Reference Point Dosage Given to Date: 5.4 Gy
Reference Point Session Dosage Given: 1.8 Gy
Session Number: 3

## 2023-12-13 ENCOUNTER — Ambulatory Visit

## 2023-12-13 ENCOUNTER — Other Ambulatory Visit: Payer: Self-pay

## 2023-12-13 ENCOUNTER — Ambulatory Visit
Admission: RE | Admit: 2023-12-13 | Discharge: 2023-12-13 | Disposition: A | Discharge status: Home / Self Care | Source: Ambulatory Visit | Attending: Radiation Oncology | Admitting: Radiation Oncology

## 2023-12-13 DIAGNOSIS — C61 Malignant neoplasm of prostate: Secondary | ICD-10-CM | POA: Diagnosis not present

## 2023-12-13 LAB — RAD ONC ARIA SESSION SUMMARY
Course Elapsed Days: 4
Plan Fractions Treated to Date: 4
Plan Prescribed Dose Per Fraction: 1.8 Gy
Plan Total Fractions Prescribed: 25
Plan Total Prescribed Dose: 45 Gy
Reference Point Dosage Given to Date: 7.2 Gy
Reference Point Session Dosage Given: 1.8 Gy
Session Number: 4

## 2023-12-16 ENCOUNTER — Ambulatory Visit
Admission: RE | Admit: 2023-12-16 | Discharge: 2023-12-16 | Disposition: A | Discharge status: Home / Self Care | Source: Ambulatory Visit | Attending: Radiation Oncology | Admitting: Radiation Oncology

## 2023-12-16 ENCOUNTER — Other Ambulatory Visit: Payer: Self-pay

## 2023-12-16 ENCOUNTER — Ambulatory Visit

## 2023-12-16 DIAGNOSIS — C61 Malignant neoplasm of prostate: Secondary | ICD-10-CM | POA: Diagnosis not present

## 2023-12-16 LAB — RAD ONC ARIA SESSION SUMMARY
Course Elapsed Days: 7
Plan Fractions Treated to Date: 5
Plan Prescribed Dose Per Fraction: 1.8 Gy
Plan Total Fractions Prescribed: 25
Plan Total Prescribed Dose: 45 Gy
Reference Point Dosage Given to Date: 9 Gy
Reference Point Session Dosage Given: 1.8 Gy
Session Number: 5

## 2023-12-17 ENCOUNTER — Other Ambulatory Visit: Payer: Self-pay

## 2023-12-17 ENCOUNTER — Ambulatory Visit
Admission: RE | Admit: 2023-12-17 | Discharge: 2023-12-17 | Disposition: A | Discharge status: Home / Self Care | Source: Ambulatory Visit | Attending: Radiation Oncology | Admitting: Radiation Oncology

## 2023-12-17 ENCOUNTER — Ambulatory Visit

## 2023-12-17 DIAGNOSIS — C61 Malignant neoplasm of prostate: Secondary | ICD-10-CM | POA: Diagnosis not present

## 2023-12-17 LAB — RAD ONC ARIA SESSION SUMMARY
Course Elapsed Days: 8
Plan Fractions Treated to Date: 6
Plan Prescribed Dose Per Fraction: 1.8 Gy
Plan Total Fractions Prescribed: 25
Plan Total Prescribed Dose: 45 Gy
Reference Point Dosage Given to Date: 10.8 Gy
Reference Point Session Dosage Given: 1.8 Gy
Session Number: 6

## 2023-12-18 ENCOUNTER — Ambulatory Visit

## 2023-12-19 ENCOUNTER — Ambulatory Visit

## 2023-12-19 ENCOUNTER — Other Ambulatory Visit: Payer: Self-pay

## 2023-12-19 ENCOUNTER — Ambulatory Visit
Admission: RE | Admit: 2023-12-19 | Discharge: 2023-12-19 | Disposition: A | Discharge status: Home / Self Care | Source: Ambulatory Visit | Attending: Radiation Oncology

## 2023-12-19 ENCOUNTER — Ambulatory Visit
Admission: RE | Admit: 2023-12-19 | Discharge: 2023-12-19 | Disposition: A | Discharge status: Home / Self Care | Source: Ambulatory Visit | Attending: Radiation Oncology | Admitting: Radiation Oncology

## 2023-12-19 DIAGNOSIS — C61 Malignant neoplasm of prostate: Secondary | ICD-10-CM | POA: Diagnosis not present

## 2023-12-19 LAB — RAD ONC ARIA SESSION SUMMARY
Course Elapsed Days: 10
Plan Fractions Treated to Date: 7
Plan Prescribed Dose Per Fraction: 1.8 Gy
Plan Total Fractions Prescribed: 25
Plan Total Prescribed Dose: 45 Gy
Reference Point Dosage Given to Date: 12.6 Gy
Reference Point Session Dosage Given: 1.8 Gy
Session Number: 7

## 2023-12-20 ENCOUNTER — Ambulatory Visit
Admission: RE | Admit: 2023-12-20 | Discharge: 2023-12-20 | Disposition: A | Discharge status: Home / Self Care | Source: Ambulatory Visit | Attending: Radiation Oncology | Admitting: Radiation Oncology

## 2023-12-20 ENCOUNTER — Other Ambulatory Visit: Payer: Self-pay

## 2023-12-20 ENCOUNTER — Ambulatory Visit

## 2023-12-20 DIAGNOSIS — C61 Malignant neoplasm of prostate: Secondary | ICD-10-CM | POA: Diagnosis not present

## 2023-12-20 LAB — RAD ONC ARIA SESSION SUMMARY
Course Elapsed Days: 11
Plan Fractions Treated to Date: 8
Plan Prescribed Dose Per Fraction: 1.8 Gy
Plan Total Fractions Prescribed: 25
Plan Total Prescribed Dose: 45 Gy
Reference Point Dosage Given to Date: 14.4 Gy
Reference Point Session Dosage Given: 1.8 Gy
Session Number: 8

## 2023-12-23 ENCOUNTER — Ambulatory Visit

## 2023-12-23 ENCOUNTER — Other Ambulatory Visit: Payer: Self-pay

## 2023-12-23 ENCOUNTER — Ambulatory Visit
Admission: RE | Admit: 2023-12-23 | Discharge: 2023-12-23 | Disposition: A | Source: Ambulatory Visit | Attending: Radiation Oncology

## 2023-12-23 DIAGNOSIS — C61 Malignant neoplasm of prostate: Secondary | ICD-10-CM | POA: Diagnosis not present

## 2023-12-23 LAB — RAD ONC ARIA SESSION SUMMARY
Course Elapsed Days: 14
Plan Fractions Treated to Date: 9
Plan Prescribed Dose Per Fraction: 1.8 Gy
Plan Total Fractions Prescribed: 25
Plan Total Prescribed Dose: 45 Gy
Reference Point Dosage Given to Date: 16.2 Gy
Reference Point Session Dosage Given: 1.8 Gy
Session Number: 9

## 2023-12-24 ENCOUNTER — Other Ambulatory Visit: Payer: Self-pay

## 2023-12-24 ENCOUNTER — Ambulatory Visit
Admission: RE | Admit: 2023-12-24 | Discharge: 2023-12-24 | Disposition: A | Discharge status: Home / Self Care | Source: Ambulatory Visit | Attending: Radiation Oncology | Admitting: Radiation Oncology

## 2023-12-24 ENCOUNTER — Ambulatory Visit

## 2023-12-24 DIAGNOSIS — C61 Malignant neoplasm of prostate: Secondary | ICD-10-CM | POA: Diagnosis not present

## 2023-12-24 LAB — RAD ONC ARIA SESSION SUMMARY
Course Elapsed Days: 15
Plan Fractions Treated to Date: 10
Plan Prescribed Dose Per Fraction: 1.8 Gy
Plan Total Fractions Prescribed: 25
Plan Total Prescribed Dose: 45 Gy
Reference Point Dosage Given to Date: 18 Gy
Reference Point Session Dosage Given: 1.8 Gy
Session Number: 10

## 2023-12-25 ENCOUNTER — Ambulatory Visit
Admission: RE | Admit: 2023-12-25 | Discharge: 2023-12-25 | Disposition: A | Discharge status: Home / Self Care | Source: Ambulatory Visit | Attending: Radiation Oncology | Admitting: Radiation Oncology

## 2023-12-25 ENCOUNTER — Ambulatory Visit

## 2023-12-25 ENCOUNTER — Other Ambulatory Visit: Payer: Self-pay

## 2023-12-25 DIAGNOSIS — C61 Malignant neoplasm of prostate: Secondary | ICD-10-CM | POA: Insufficient documentation

## 2023-12-25 LAB — RAD ONC ARIA SESSION SUMMARY
Course Elapsed Days: 16
Plan Fractions Treated to Date: 11
Plan Prescribed Dose Per Fraction: 1.8 Gy
Plan Total Fractions Prescribed: 25
Plan Total Prescribed Dose: 45 Gy
Reference Point Dosage Given to Date: 19.8 Gy
Reference Point Session Dosage Given: 1.8 Gy
Session Number: 11

## 2023-12-26 ENCOUNTER — Other Ambulatory Visit: Payer: Self-pay

## 2023-12-26 ENCOUNTER — Ambulatory Visit
Admission: RE | Admit: 2023-12-26 | Discharge: 2023-12-26 | Disposition: A | Source: Ambulatory Visit | Attending: Radiation Oncology

## 2023-12-26 ENCOUNTER — Ambulatory Visit

## 2023-12-26 DIAGNOSIS — C61 Malignant neoplasm of prostate: Secondary | ICD-10-CM | POA: Diagnosis not present

## 2023-12-26 LAB — RAD ONC ARIA SESSION SUMMARY
Course Elapsed Days: 17
Plan Fractions Treated to Date: 12
Plan Prescribed Dose Per Fraction: 1.8 Gy
Plan Total Fractions Prescribed: 25
Plan Total Prescribed Dose: 45 Gy
Reference Point Dosage Given to Date: 21.6 Gy
Reference Point Session Dosage Given: 1.8 Gy
Session Number: 12

## 2023-12-27 ENCOUNTER — Ambulatory Visit
Admission: RE | Admit: 2023-12-27 | Discharge: 2023-12-27 | Disposition: A | Discharge status: Home / Self Care | Source: Ambulatory Visit | Attending: Radiation Oncology | Admitting: Radiation Oncology

## 2023-12-27 ENCOUNTER — Ambulatory Visit

## 2023-12-27 ENCOUNTER — Other Ambulatory Visit: Payer: Self-pay

## 2023-12-27 DIAGNOSIS — C61 Malignant neoplasm of prostate: Secondary | ICD-10-CM | POA: Diagnosis not present

## 2023-12-27 LAB — RAD ONC ARIA SESSION SUMMARY
Course Elapsed Days: 18
Plan Fractions Treated to Date: 13
Plan Prescribed Dose Per Fraction: 1.8 Gy
Plan Total Fractions Prescribed: 25
Plan Total Prescribed Dose: 45 Gy
Reference Point Dosage Given to Date: 23.4 Gy
Reference Point Session Dosage Given: 1.8 Gy
Session Number: 13

## 2023-12-30 ENCOUNTER — Ambulatory Visit

## 2023-12-30 ENCOUNTER — Other Ambulatory Visit: Payer: Self-pay

## 2023-12-30 ENCOUNTER — Ambulatory Visit
Admission: RE | Admit: 2023-12-30 | Discharge: 2023-12-30 | Disposition: A | Discharge status: Home / Self Care | Source: Ambulatory Visit | Attending: Radiation Oncology | Admitting: Radiation Oncology

## 2023-12-30 DIAGNOSIS — C61 Malignant neoplasm of prostate: Secondary | ICD-10-CM | POA: Diagnosis not present

## 2023-12-30 LAB — RAD ONC ARIA SESSION SUMMARY
Course Elapsed Days: 21
Plan Fractions Treated to Date: 14
Plan Prescribed Dose Per Fraction: 1.8 Gy
Plan Total Fractions Prescribed: 25
Plan Total Prescribed Dose: 45 Gy
Reference Point Dosage Given to Date: 25.2 Gy
Reference Point Session Dosage Given: 1.8 Gy
Session Number: 14

## 2023-12-31 ENCOUNTER — Other Ambulatory Visit: Payer: Self-pay

## 2023-12-31 ENCOUNTER — Ambulatory Visit
Admission: RE | Admit: 2023-12-31 | Discharge: 2023-12-31 | Disposition: A | Discharge status: Home / Self Care | Source: Ambulatory Visit | Attending: Radiation Oncology | Admitting: Radiation Oncology

## 2023-12-31 ENCOUNTER — Ambulatory Visit

## 2023-12-31 DIAGNOSIS — C61 Malignant neoplasm of prostate: Secondary | ICD-10-CM | POA: Diagnosis not present

## 2023-12-31 LAB — RAD ONC ARIA SESSION SUMMARY
Course Elapsed Days: 22
Plan Fractions Treated to Date: 15
Plan Prescribed Dose Per Fraction: 1.8 Gy
Plan Total Fractions Prescribed: 25
Plan Total Prescribed Dose: 45 Gy
Reference Point Dosage Given to Date: 27 Gy
Reference Point Session Dosage Given: 1.8 Gy
Session Number: 15

## 2024-01-01 ENCOUNTER — Ambulatory Visit

## 2024-01-02 ENCOUNTER — Ambulatory Visit: Admission: RE | Admit: 2024-01-02 | Discharge: 2024-01-02 | Attending: Radiation Oncology

## 2024-01-02 ENCOUNTER — Ambulatory Visit

## 2024-01-02 ENCOUNTER — Ambulatory Visit
Admission: RE | Admit: 2024-01-02 | Discharge: 2024-01-02 | Disposition: A | Discharge status: Home / Self Care | Source: Ambulatory Visit | Attending: Radiation Oncology | Admitting: Radiation Oncology

## 2024-01-02 ENCOUNTER — Other Ambulatory Visit: Payer: Self-pay

## 2024-01-02 DIAGNOSIS — C61 Malignant neoplasm of prostate: Secondary | ICD-10-CM | POA: Diagnosis not present

## 2024-01-02 LAB — RAD ONC ARIA SESSION SUMMARY
Course Elapsed Days: 24
Plan Fractions Treated to Date: 16
Plan Prescribed Dose Per Fraction: 1.8 Gy
Plan Total Fractions Prescribed: 25
Plan Total Prescribed Dose: 45 Gy
Reference Point Dosage Given to Date: 28.8 Gy
Reference Point Session Dosage Given: 1.8 Gy
Session Number: 16

## 2024-01-03 ENCOUNTER — Other Ambulatory Visit: Payer: Self-pay

## 2024-01-03 ENCOUNTER — Ambulatory Visit

## 2024-01-03 ENCOUNTER — Ambulatory Visit
Admission: RE | Admit: 2024-01-03 | Discharge: 2024-01-03 | Disposition: A | Discharge status: Home / Self Care | Source: Ambulatory Visit | Attending: Radiation Oncology | Admitting: Radiation Oncology

## 2024-01-03 DIAGNOSIS — C61 Malignant neoplasm of prostate: Secondary | ICD-10-CM | POA: Diagnosis not present

## 2024-01-03 LAB — RAD ONC ARIA SESSION SUMMARY
Course Elapsed Days: 25
Plan Fractions Treated to Date: 17
Plan Prescribed Dose Per Fraction: 1.8 Gy
Plan Total Fractions Prescribed: 25
Plan Total Prescribed Dose: 45 Gy
Reference Point Dosage Given to Date: 30.6 Gy
Reference Point Session Dosage Given: 1.8 Gy
Session Number: 17

## 2024-01-06 ENCOUNTER — Ambulatory Visit

## 2024-01-06 ENCOUNTER — Other Ambulatory Visit: Payer: Self-pay

## 2024-01-06 ENCOUNTER — Ambulatory Visit
Admission: RE | Admit: 2024-01-06 | Discharge: 2024-01-06 | Disposition: A | Discharge status: Home / Self Care | Source: Ambulatory Visit | Attending: Radiation Oncology | Admitting: Radiation Oncology

## 2024-01-06 DIAGNOSIS — C61 Malignant neoplasm of prostate: Secondary | ICD-10-CM | POA: Diagnosis not present

## 2024-01-06 LAB — RAD ONC ARIA SESSION SUMMARY
Course Elapsed Days: 28
Plan Fractions Treated to Date: 18
Plan Prescribed Dose Per Fraction: 1.8 Gy
Plan Total Fractions Prescribed: 25
Plan Total Prescribed Dose: 45 Gy
Reference Point Dosage Given to Date: 32.4 Gy
Reference Point Session Dosage Given: 1.8 Gy
Session Number: 18

## 2024-01-07 ENCOUNTER — Ambulatory Visit
Admission: RE | Admit: 2024-01-07 | Discharge: 2024-01-07 | Disposition: A | Discharge status: Home / Self Care | Source: Ambulatory Visit | Attending: Radiation Oncology | Admitting: Radiation Oncology

## 2024-01-07 ENCOUNTER — Other Ambulatory Visit: Payer: Self-pay

## 2024-01-07 ENCOUNTER — Ambulatory Visit

## 2024-01-07 DIAGNOSIS — C61 Malignant neoplasm of prostate: Secondary | ICD-10-CM | POA: Diagnosis not present

## 2024-01-07 LAB — RAD ONC ARIA SESSION SUMMARY
Course Elapsed Days: 29
Plan Fractions Treated to Date: 19
Plan Prescribed Dose Per Fraction: 1.8 Gy
Plan Total Fractions Prescribed: 25
Plan Total Prescribed Dose: 45 Gy
Reference Point Dosage Given to Date: 34.2 Gy
Reference Point Session Dosage Given: 1.8 Gy
Session Number: 19

## 2024-01-08 ENCOUNTER — Other Ambulatory Visit: Payer: Self-pay

## 2024-01-08 ENCOUNTER — Ambulatory Visit

## 2024-01-08 ENCOUNTER — Ambulatory Visit
Admission: RE | Admit: 2024-01-08 | Discharge: 2024-01-08 | Disposition: A | Source: Ambulatory Visit | Attending: Radiation Oncology

## 2024-01-08 DIAGNOSIS — C61 Malignant neoplasm of prostate: Secondary | ICD-10-CM | POA: Diagnosis not present

## 2024-01-08 LAB — RAD ONC ARIA SESSION SUMMARY
Course Elapsed Days: 30
Plan Fractions Treated to Date: 20
Plan Prescribed Dose Per Fraction: 1.8 Gy
Plan Total Fractions Prescribed: 25
Plan Total Prescribed Dose: 45 Gy
Reference Point Dosage Given to Date: 36 Gy
Reference Point Session Dosage Given: 1.8 Gy
Session Number: 20

## 2024-01-09 ENCOUNTER — Ambulatory Visit
Admission: RE | Admit: 2024-01-09 | Discharge: 2024-01-09 | Disposition: A | Discharge status: Home / Self Care | Source: Ambulatory Visit | Attending: Radiation Oncology | Admitting: Radiation Oncology

## 2024-01-09 ENCOUNTER — Ambulatory Visit

## 2024-01-09 ENCOUNTER — Other Ambulatory Visit: Payer: Self-pay

## 2024-01-09 DIAGNOSIS — C61 Malignant neoplasm of prostate: Secondary | ICD-10-CM | POA: Diagnosis not present

## 2024-01-09 LAB — RAD ONC ARIA SESSION SUMMARY
Course Elapsed Days: 31
Plan Fractions Treated to Date: 21
Plan Prescribed Dose Per Fraction: 1.8 Gy
Plan Total Fractions Prescribed: 25
Plan Total Prescribed Dose: 45 Gy
Reference Point Dosage Given to Date: 37.8 Gy
Reference Point Session Dosage Given: 1.8 Gy
Session Number: 21

## 2024-01-10 ENCOUNTER — Ambulatory Visit
Admission: RE | Admit: 2024-01-10 | Discharge: 2024-01-10 | Disposition: A | Discharge status: Home / Self Care | Source: Ambulatory Visit | Attending: Radiation Oncology | Admitting: Radiation Oncology

## 2024-01-10 ENCOUNTER — Other Ambulatory Visit: Payer: Self-pay

## 2024-01-10 ENCOUNTER — Ambulatory Visit

## 2024-01-10 ENCOUNTER — Ambulatory Visit
Admission: RE | Admit: 2024-01-10 | Discharge: 2024-01-10 | Disposition: A | Source: Ambulatory Visit | Attending: Radiation Oncology

## 2024-01-10 DIAGNOSIS — C61 Malignant neoplasm of prostate: Secondary | ICD-10-CM | POA: Diagnosis not present

## 2024-01-10 LAB — RAD ONC ARIA SESSION SUMMARY
Course Elapsed Days: 32
Plan Fractions Treated to Date: 22
Plan Prescribed Dose Per Fraction: 1.8 Gy
Plan Total Fractions Prescribed: 25
Plan Total Prescribed Dose: 45 Gy
Reference Point Dosage Given to Date: 39.6 Gy
Reference Point Session Dosage Given: 1.8 Gy
Session Number: 22

## 2024-01-13 ENCOUNTER — Ambulatory Visit

## 2024-01-13 ENCOUNTER — Other Ambulatory Visit: Payer: Self-pay

## 2024-01-13 ENCOUNTER — Ambulatory Visit
Admission: RE | Admit: 2024-01-13 | Discharge: 2024-01-13 | Disposition: A | Source: Ambulatory Visit | Attending: Radiation Oncology

## 2024-01-13 DIAGNOSIS — C61 Malignant neoplasm of prostate: Secondary | ICD-10-CM | POA: Diagnosis not present

## 2024-01-13 LAB — RAD ONC ARIA SESSION SUMMARY
Course Elapsed Days: 35
Plan Fractions Treated to Date: 23
Plan Prescribed Dose Per Fraction: 1.8 Gy
Plan Total Fractions Prescribed: 25
Plan Total Prescribed Dose: 45 Gy
Reference Point Dosage Given to Date: 41.4 Gy
Reference Point Session Dosage Given: 1.8 Gy
Session Number: 23

## 2024-01-14 ENCOUNTER — Ambulatory Visit

## 2024-01-14 ENCOUNTER — Other Ambulatory Visit: Payer: Self-pay

## 2024-01-14 ENCOUNTER — Ambulatory Visit
Admission: RE | Admit: 2024-01-14 | Discharge: 2024-01-14 | Disposition: A | Source: Ambulatory Visit | Attending: Radiation Oncology

## 2024-01-14 DIAGNOSIS — C61 Malignant neoplasm of prostate: Secondary | ICD-10-CM | POA: Diagnosis not present

## 2024-01-14 LAB — RAD ONC ARIA SESSION SUMMARY
Course Elapsed Days: 36
Plan Fractions Treated to Date: 24
Plan Prescribed Dose Per Fraction: 1.8 Gy
Plan Total Fractions Prescribed: 25
Plan Total Prescribed Dose: 45 Gy
Reference Point Dosage Given to Date: 43.2 Gy
Reference Point Session Dosage Given: 1.8 Gy
Session Number: 24

## 2024-01-15 ENCOUNTER — Ambulatory Visit
Admission: RE | Admit: 2024-01-15 | Discharge: 2024-01-15 | Disposition: A | Source: Ambulatory Visit | Attending: Radiation Oncology

## 2024-01-15 ENCOUNTER — Ambulatory Visit

## 2024-01-15 ENCOUNTER — Other Ambulatory Visit: Payer: Self-pay

## 2024-01-15 DIAGNOSIS — C61 Malignant neoplasm of prostate: Secondary | ICD-10-CM | POA: Diagnosis not present

## 2024-01-15 LAB — RAD ONC ARIA SESSION SUMMARY
Course Elapsed Days: 37
Plan Fractions Treated to Date: 25
Plan Prescribed Dose Per Fraction: 1.8 Gy
Plan Total Fractions Prescribed: 25
Plan Total Prescribed Dose: 45 Gy
Reference Point Dosage Given to Date: 45 Gy
Reference Point Session Dosage Given: 1.8 Gy
Session Number: 25

## 2024-01-16 ENCOUNTER — Ambulatory Visit
Admission: RE | Admit: 2024-01-16 | Discharge: 2024-01-16 | Disposition: A | Source: Ambulatory Visit | Attending: Radiation Oncology

## 2024-01-16 ENCOUNTER — Ambulatory Visit

## 2024-01-16 ENCOUNTER — Other Ambulatory Visit: Payer: Self-pay

## 2024-01-16 DIAGNOSIS — C61 Malignant neoplasm of prostate: Secondary | ICD-10-CM | POA: Diagnosis not present

## 2024-01-16 LAB — RAD ONC ARIA SESSION SUMMARY
Course Elapsed Days: 38
Plan Fractions Treated to Date: 1
Plan Prescribed Dose Per Fraction: 2 Gy
Plan Total Fractions Prescribed: 15
Plan Total Prescribed Dose: 30 Gy
Reference Point Dosage Given to Date: 2 Gy
Reference Point Session Dosage Given: 2 Gy
Session Number: 26

## 2024-01-17 ENCOUNTER — Ambulatory Visit

## 2024-01-17 ENCOUNTER — Ambulatory Visit
Admission: RE | Admit: 2024-01-17 | Discharge: 2024-01-17 | Disposition: A | Discharge status: Home / Self Care | Source: Ambulatory Visit | Attending: Radiation Oncology | Admitting: Radiation Oncology

## 2024-01-17 ENCOUNTER — Other Ambulatory Visit: Payer: Self-pay

## 2024-01-17 DIAGNOSIS — C61 Malignant neoplasm of prostate: Secondary | ICD-10-CM | POA: Diagnosis not present

## 2024-01-17 LAB — RAD ONC ARIA SESSION SUMMARY
Course Elapsed Days: 39
Plan Fractions Treated to Date: 2
Plan Prescribed Dose Per Fraction: 2 Gy
Plan Total Fractions Prescribed: 15
Plan Total Prescribed Dose: 30 Gy
Reference Point Dosage Given to Date: 4 Gy
Reference Point Session Dosage Given: 2 Gy
Session Number: 27

## 2024-01-20 ENCOUNTER — Other Ambulatory Visit: Payer: Self-pay

## 2024-01-20 ENCOUNTER — Ambulatory Visit

## 2024-01-20 ENCOUNTER — Ambulatory Visit
Admission: RE | Admit: 2024-01-20 | Discharge: 2024-01-20 | Disposition: A | Source: Ambulatory Visit | Attending: Radiation Oncology

## 2024-01-20 DIAGNOSIS — C61 Malignant neoplasm of prostate: Secondary | ICD-10-CM | POA: Diagnosis not present

## 2024-01-20 LAB — RAD ONC ARIA SESSION SUMMARY
Course Elapsed Days: 42
Plan Fractions Treated to Date: 3
Plan Prescribed Dose Per Fraction: 2 Gy
Plan Total Fractions Prescribed: 15
Plan Total Prescribed Dose: 30 Gy
Reference Point Dosage Given to Date: 6 Gy
Reference Point Session Dosage Given: 2 Gy
Session Number: 28

## 2024-01-21 ENCOUNTER — Ambulatory Visit

## 2024-01-21 ENCOUNTER — Ambulatory Visit
Admission: RE | Admit: 2024-01-21 | Discharge: 2024-01-21 | Disposition: A | Discharge status: Home / Self Care | Source: Ambulatory Visit | Attending: Radiation Oncology | Admitting: Radiation Oncology

## 2024-01-21 ENCOUNTER — Other Ambulatory Visit: Payer: Self-pay

## 2024-01-21 DIAGNOSIS — C61 Malignant neoplasm of prostate: Secondary | ICD-10-CM | POA: Diagnosis not present

## 2024-01-21 LAB — RAD ONC ARIA SESSION SUMMARY
Course Elapsed Days: 43
Plan Fractions Treated to Date: 4
Plan Prescribed Dose Per Fraction: 2 Gy
Plan Total Fractions Prescribed: 15
Plan Total Prescribed Dose: 30 Gy
Reference Point Dosage Given to Date: 8 Gy
Reference Point Session Dosage Given: 2 Gy
Session Number: 29

## 2024-01-22 ENCOUNTER — Other Ambulatory Visit: Payer: Self-pay

## 2024-01-22 ENCOUNTER — Ambulatory Visit
Admission: RE | Admit: 2024-01-22 | Discharge: 2024-01-22 | Disposition: A | Source: Ambulatory Visit | Attending: Radiation Oncology

## 2024-01-22 ENCOUNTER — Ambulatory Visit

## 2024-01-22 DIAGNOSIS — C61 Malignant neoplasm of prostate: Secondary | ICD-10-CM | POA: Diagnosis not present

## 2024-01-22 LAB — RAD ONC ARIA SESSION SUMMARY
Course Elapsed Days: 44
Plan Fractions Treated to Date: 5
Plan Prescribed Dose Per Fraction: 2 Gy
Plan Total Fractions Prescribed: 15
Plan Total Prescribed Dose: 30 Gy
Reference Point Dosage Given to Date: 10 Gy
Reference Point Session Dosage Given: 2 Gy
Session Number: 30

## 2024-01-23 ENCOUNTER — Ambulatory Visit
Admission: RE | Admit: 2024-01-23 | Discharge: 2024-01-23 | Disposition: A | Discharge status: Home / Self Care | Source: Ambulatory Visit | Attending: Radiation Oncology | Admitting: Radiation Oncology

## 2024-01-23 ENCOUNTER — Other Ambulatory Visit: Payer: Self-pay

## 2024-01-23 ENCOUNTER — Ambulatory Visit

## 2024-01-23 DIAGNOSIS — C61 Malignant neoplasm of prostate: Secondary | ICD-10-CM | POA: Diagnosis not present

## 2024-01-23 LAB — RAD ONC ARIA SESSION SUMMARY
Course Elapsed Days: 45
Plan Fractions Treated to Date: 6
Plan Prescribed Dose Per Fraction: 2 Gy
Plan Total Fractions Prescribed: 15
Plan Total Prescribed Dose: 30 Gy
Reference Point Dosage Given to Date: 12 Gy
Reference Point Session Dosage Given: 2 Gy
Session Number: 31

## 2024-01-24 ENCOUNTER — Ambulatory Visit
Admission: RE | Admit: 2024-01-24 | Discharge: 2024-01-24 | Disposition: A | Discharge status: Home / Self Care | Source: Ambulatory Visit | Attending: Radiation Oncology | Admitting: Radiation Oncology

## 2024-01-24 ENCOUNTER — Ambulatory Visit

## 2024-01-24 ENCOUNTER — Other Ambulatory Visit: Payer: Self-pay

## 2024-01-24 DIAGNOSIS — C61 Malignant neoplasm of prostate: Secondary | ICD-10-CM | POA: Diagnosis not present

## 2024-01-24 LAB — RAD ONC ARIA SESSION SUMMARY
Course Elapsed Days: 46
Plan Fractions Treated to Date: 7
Plan Prescribed Dose Per Fraction: 2 Gy
Plan Total Fractions Prescribed: 15
Plan Total Prescribed Dose: 30 Gy
Reference Point Dosage Given to Date: 14 Gy
Reference Point Session Dosage Given: 2 Gy
Session Number: 32

## 2024-01-27 ENCOUNTER — Other Ambulatory Visit: Payer: Self-pay

## 2024-01-27 ENCOUNTER — Ambulatory Visit
Admission: RE | Admit: 2024-01-27 | Discharge: 2024-01-27 | Disposition: A | Discharge status: Home / Self Care | Source: Ambulatory Visit | Attending: Radiation Oncology | Admitting: Radiation Oncology

## 2024-01-27 ENCOUNTER — Ambulatory Visit

## 2024-01-27 DIAGNOSIS — Z510 Encounter for antineoplastic radiation therapy: Secondary | ICD-10-CM | POA: Insufficient documentation

## 2024-01-27 DIAGNOSIS — C61 Malignant neoplasm of prostate: Secondary | ICD-10-CM | POA: Insufficient documentation

## 2024-01-27 LAB — RAD ONC ARIA SESSION SUMMARY
Course Elapsed Days: 49
Plan Fractions Treated to Date: 8
Plan Prescribed Dose Per Fraction: 2 Gy
Plan Total Fractions Prescribed: 15
Plan Total Prescribed Dose: 30 Gy
Reference Point Dosage Given to Date: 16 Gy
Reference Point Session Dosage Given: 2 Gy
Session Number: 33

## 2024-01-28 ENCOUNTER — Ambulatory Visit
Admission: RE | Admit: 2024-01-28 | Discharge: 2024-01-28 | Disposition: A | Discharge status: Home / Self Care | Source: Ambulatory Visit | Attending: Radiation Oncology | Admitting: Radiation Oncology

## 2024-01-28 ENCOUNTER — Other Ambulatory Visit: Payer: Self-pay

## 2024-01-28 ENCOUNTER — Ambulatory Visit

## 2024-01-28 DIAGNOSIS — Z510 Encounter for antineoplastic radiation therapy: Secondary | ICD-10-CM | POA: Diagnosis not present

## 2024-01-28 LAB — RAD ONC ARIA SESSION SUMMARY
Course Elapsed Days: 50
Plan Fractions Treated to Date: 9
Plan Prescribed Dose Per Fraction: 2 Gy
Plan Total Fractions Prescribed: 15
Plan Total Prescribed Dose: 30 Gy
Reference Point Dosage Given to Date: 18 Gy
Reference Point Session Dosage Given: 2 Gy
Session Number: 34

## 2024-01-29 ENCOUNTER — Other Ambulatory Visit: Payer: Self-pay

## 2024-01-29 ENCOUNTER — Ambulatory Visit
Admission: RE | Admit: 2024-01-29 | Discharge: 2024-01-29 | Disposition: A | Discharge status: Home / Self Care | Source: Ambulatory Visit | Attending: Radiation Oncology | Admitting: Radiation Oncology

## 2024-01-29 ENCOUNTER — Ambulatory Visit

## 2024-01-29 DIAGNOSIS — Z510 Encounter for antineoplastic radiation therapy: Secondary | ICD-10-CM | POA: Diagnosis not present

## 2024-01-29 LAB — RAD ONC ARIA SESSION SUMMARY
Course Elapsed Days: 51
Plan Fractions Treated to Date: 10
Plan Prescribed Dose Per Fraction: 2 Gy
Plan Total Fractions Prescribed: 15
Plan Total Prescribed Dose: 30 Gy
Reference Point Dosage Given to Date: 20 Gy
Reference Point Session Dosage Given: 2 Gy
Session Number: 35

## 2024-01-30 ENCOUNTER — Ambulatory Visit
Admission: RE | Admit: 2024-01-30 | Discharge: 2024-01-30 | Disposition: A | Discharge status: Home / Self Care | Source: Ambulatory Visit | Attending: Radiation Oncology | Admitting: Radiation Oncology

## 2024-01-30 ENCOUNTER — Ambulatory Visit

## 2024-01-30 ENCOUNTER — Other Ambulatory Visit: Payer: Self-pay

## 2024-01-30 DIAGNOSIS — Z510 Encounter for antineoplastic radiation therapy: Secondary | ICD-10-CM | POA: Diagnosis not present

## 2024-01-30 LAB — RAD ONC ARIA SESSION SUMMARY
Course Elapsed Days: 52
Plan Fractions Treated to Date: 11
Plan Prescribed Dose Per Fraction: 2 Gy
Plan Total Fractions Prescribed: 15
Plan Total Prescribed Dose: 30 Gy
Reference Point Dosage Given to Date: 22 Gy
Reference Point Session Dosage Given: 2 Gy
Session Number: 36

## 2024-01-31 ENCOUNTER — Ambulatory Visit
Admission: RE | Admit: 2024-01-31 | Discharge: 2024-01-31 | Disposition: A | Discharge status: Home / Self Care | Source: Ambulatory Visit | Attending: Radiation Oncology | Admitting: Radiation Oncology

## 2024-01-31 ENCOUNTER — Ambulatory Visit

## 2024-01-31 ENCOUNTER — Other Ambulatory Visit: Payer: Self-pay

## 2024-01-31 DIAGNOSIS — Z510 Encounter for antineoplastic radiation therapy: Secondary | ICD-10-CM | POA: Diagnosis not present

## 2024-01-31 LAB — RAD ONC ARIA SESSION SUMMARY
Course Elapsed Days: 53
Plan Fractions Treated to Date: 12
Plan Prescribed Dose Per Fraction: 2 Gy
Plan Total Fractions Prescribed: 15
Plan Total Prescribed Dose: 30 Gy
Reference Point Dosage Given to Date: 24 Gy
Reference Point Session Dosage Given: 2 Gy
Session Number: 37

## 2024-02-03 ENCOUNTER — Ambulatory Visit
Admission: RE | Admit: 2024-02-03 | Discharge: 2024-02-03 | Disposition: A | Discharge status: Home / Self Care | Source: Ambulatory Visit | Attending: Radiation Oncology | Admitting: Radiation Oncology

## 2024-02-03 ENCOUNTER — Ambulatory Visit

## 2024-02-03 ENCOUNTER — Other Ambulatory Visit: Payer: Self-pay

## 2024-02-03 DIAGNOSIS — Z510 Encounter for antineoplastic radiation therapy: Secondary | ICD-10-CM | POA: Diagnosis not present

## 2024-02-03 LAB — RAD ONC ARIA SESSION SUMMARY
Course Elapsed Days: 56
Plan Fractions Treated to Date: 13
Plan Prescribed Dose Per Fraction: 2 Gy
Plan Total Fractions Prescribed: 15
Plan Total Prescribed Dose: 30 Gy
Reference Point Dosage Given to Date: 26 Gy
Reference Point Session Dosage Given: 2 Gy
Session Number: 38

## 2024-02-04 ENCOUNTER — Other Ambulatory Visit: Payer: Self-pay

## 2024-02-04 ENCOUNTER — Ambulatory Visit

## 2024-02-04 ENCOUNTER — Ambulatory Visit
Admission: RE | Admit: 2024-02-04 | Discharge: 2024-02-04 | Disposition: A | Discharge status: Home / Self Care | Source: Ambulatory Visit | Attending: Radiation Oncology | Admitting: Radiation Oncology

## 2024-02-04 DIAGNOSIS — Z510 Encounter for antineoplastic radiation therapy: Secondary | ICD-10-CM | POA: Diagnosis not present

## 2024-02-04 LAB — RAD ONC ARIA SESSION SUMMARY
Course Elapsed Days: 57
Plan Fractions Treated to Date: 14
Plan Prescribed Dose Per Fraction: 2 Gy
Plan Total Fractions Prescribed: 15
Plan Total Prescribed Dose: 30 Gy
Reference Point Dosage Given to Date: 28 Gy
Reference Point Session Dosage Given: 2 Gy
Session Number: 39

## 2024-02-05 ENCOUNTER — Other Ambulatory Visit: Payer: Self-pay

## 2024-02-05 ENCOUNTER — Ambulatory Visit
Admission: RE | Admit: 2024-02-05 | Discharge: 2024-02-05 | Disposition: A | Discharge status: Home / Self Care | Source: Ambulatory Visit | Attending: Radiation Oncology | Admitting: Radiation Oncology

## 2024-02-05 DIAGNOSIS — C61 Malignant neoplasm of prostate: Secondary | ICD-10-CM

## 2024-02-05 DIAGNOSIS — Z510 Encounter for antineoplastic radiation therapy: Secondary | ICD-10-CM | POA: Diagnosis not present

## 2024-02-05 LAB — RAD ONC ARIA SESSION SUMMARY
Course Elapsed Days: 58
Plan Fractions Treated to Date: 15
Plan Prescribed Dose Per Fraction: 2 Gy
Plan Total Fractions Prescribed: 15
Plan Total Prescribed Dose: 30 Gy
Reference Point Dosage Given to Date: 30 Gy
Reference Point Session Dosage Given: 2 Gy
Session Number: 40

## 2024-02-07 NOTE — Progress Notes (Signed)
 Patient was a RadOnc Consult on 08/26/23 for his stage T1c adenocarcinoma of the prostate with Gleason score of 4+3, and PSA of 21, adjusted for finasteride.  Patient proceed with treatment recommendations of 8 weeks of external beam therapy concurrent with ADT and had his final radiation treatment on 02/05/2024.   Patient is scheduled for a post treatment nurse call on 03/10/2024 and has a [facility] follow up on 03/25/2024 with Dr. Rosalind.

## 2024-02-07 NOTE — Radiation Completion Notes (Addendum)
" °  Radiation Oncology         [phone] ________________________________  Name: Jose Scott MRN: [id number]  Date: 02/05/2024  DOB: 04-23-1950  Referring Physician: DONNICE SIAD, M.D. Date of Service: 2024-02-07 Radiation Oncologist: Adina Barge, M.D. [facility]     RADIATION ONCOLOGY END OF TREATMENT NOTE     Diagnosis: 73 y.o. gentleman with Stage T1c adenocarcinoma of the prostate with Gleason score of 4+3, and PSA of 21, adjusted for finasteride.   Intent: Curative     ==========DELIVERED PLANS==========  First Treatment Date: 2023-12-09 Last Treatment Date: 2024-02-05   Plan Name: Prostate_Pelv Site: Prostate Technique: IMRT Mode: Photon Dose Per Fraction: 1.8 Gy Prescribed Dose (Delivered / Prescribed): 45 Gy / 45 Gy Prescribed Fxs (Delivered / Prescribed): 25 / 25   Plan Name: Prostate_Bst Site: Prostate Technique: IMRT Mode: Photon Dose Per Fraction: 2 Gy Prescribed Dose (Delivered / Prescribed): 30 Gy / 30 Gy Prescribed Fxs (Delivered / Prescribed): 15 / 15     ==========ON TREATMENT VISIT DATES========== 2023-12-12, 2023-12-19, 2023-12-27, 2024-01-02, 2024-01-10, 2024-01-16, 2024-01-24, 2024-01-30     See weekly On Treatment Notes in Epic for details in the Media tab (listed as Progress notes on the On Treatment Visit Dates listed above).  He tolerated the daily radiation treatments well with only mild increased LUTS and modest fatigue.  The patient will receive a call in about one month from the radiation oncology department. He will continue follow up with Dr. Siad as well.  ------------------------------------------------   Donnice Barge, MD [facility]  Radiation Oncology Direct Dial: [phone]  Fax: [phone] [email or web address]  Skype  LinkedIn    "

## 2024-02-28 NOTE — Progress Notes (Signed)
  Radiation Oncology         [phone] ________________________________  Name: Jose Scott MRN: [id number]  Date of Service: 03/10/2024  DOB: Aug 04, 1950  Post Treatment Telephone Note  Diagnosis:   73 y.o. gentleman with Stage T1c adenocarcinoma of the prostate with Gleason score of 4+3, and PSA of 21, adjusted for finasteride.   First Treatment Date: 2023-12-09 Last Treatment Date: 2024-02-05   Plan Name: Prostate_Pelv Site: Prostate Technique: IMRT Mode: Photon Dose Per Fraction: 1.8 Gy Prescribed Dose (Delivered / Prescribed): 45 Gy / 45 Gy Prescribed Fxs (Delivered / Prescribed): 25 / 25   Plan Name: Prostate_Bst Site: Prostate Technique: IMRT Mode: Photon Dose Per Fraction: 2 Gy Prescribed Dose (Delivered / Prescribed): 30 Gy / 30 Gy Prescribed Fxs (Delivered / Prescribed): 15 / 15  Pre Treatment IPSS Score: 10 (as documented in the provider consult note)  The patient {WAS/WAS [phone]::was not} available for call today.   Symptoms of fatigue {ACTIONS; HAVE/HAVE NOT:19434} improved since completing therapy.  Symptoms of bladder changes {ACTIONS; HAVE/HAVE WNU:80565} improved since completing therapy. Current symptoms include ***, and medications for bladder symptoms include ***.  Symptoms of bowel changes {ACTIONS; HAVE/HAVE NOT:19434} improved since completing therapy. Current symptoms include ***, and medications for bowel symptoms include ***.   Post Treatment IPSS Score: IPSS Questionnaire (AUA-7): Over the past month.   1)  How often have you had a sensation of not emptying your bladder completely after you finish urinating?  {Rating:19227}  2)  How often have you had to urinate again less than two hours after you finished urinating? {Rating:19227}  3)  How often have you found you stopped and started again several times when you urinated?  {Rating:19227}  4) How difficult have you found it to postpone urination?  {Rating:19227}  5) How often  have you had a weak urinary stream?  {Rating:19227}  6) How often have you had to push or strain to begin urination?  {Rating:19227}  7) How many times did you most typically get up to urinate from the time you went to bed until the time you got up in the morning?  {Rating:19228}  Total score:  {0-35:19561} Which indicates {Mild/Moderate/Severe:20260} symptoms  0-7 mildly symptomatic   8-19 moderately symptomatic   20-35 severely symptomatic   Patient (has a scheduled follow up visit with his urologist, Dr. Donnice Siad, on ***/ does not currently have any scheduled follow up with his urologist to his knowledge so he was advised to call [facility] to schedule his post-treatment follow up with Dr. Donnice Siad) for ongoing surveillance. He was counseled that PSA levels will be drawn in the urology office, and was reassured that additional time is expected to improve bowel and bladder symptoms. He was encouraged to call back with concerns or questions regarding radiation.

## 2024-03-10 ENCOUNTER — Ambulatory Visit
Admission: RE | Admit: 2024-03-10 | Discharge: 2024-03-10 | Disposition: A | Source: Ambulatory Visit | Attending: Radiation Oncology

## 2024-03-10 DIAGNOSIS — C61 Malignant neoplasm of prostate: Secondary | ICD-10-CM

## 2024-03-15 ENCOUNTER — Telehealth: Payer: Self-pay | Admitting: Radiation Oncology

## 2024-03-15 NOTE — Telephone Encounter (Signed)
 12/21 Received Medical Record Release form from [place]. at [facility].  Fax Medical Record as requested to fax # [phone] .

## 2024-04-30 ENCOUNTER — Other Ambulatory Visit: Payer: Self-pay | Admitting: Urology

## 2024-04-30 DIAGNOSIS — C61 Malignant neoplasm of prostate: Secondary | ICD-10-CM
